# Patient Record
Sex: Female | Born: 1955 | Race: White | Hispanic: No | Marital: Married | Smoking: Never smoker
Health system: Southern US, Community
[De-identification: ages and names within clinical notes are randomized; demographics above are authoritative.]

## PROBLEM LIST (undated history)

## (undated) DIAGNOSIS — G610 Guillain-Barre syndrome: Secondary | ICD-10-CM

## (undated) DIAGNOSIS — M1990 Unspecified osteoarthritis, unspecified site: Secondary | ICD-10-CM

## (undated) DIAGNOSIS — A770 Spotted fever due to Rickettsia rickettsii: Secondary | ICD-10-CM

## (undated) DIAGNOSIS — F419 Anxiety disorder, unspecified: Secondary | ICD-10-CM

## (undated) DIAGNOSIS — D699 Hemorrhagic condition, unspecified: Secondary | ICD-10-CM

## (undated) HISTORY — PX: WISDOM TOOTH EXTRACTION: SHX21

## (undated) HISTORY — PX: OTHER SURGICAL HISTORY: SHX169

## (undated) HISTORY — PX: CATARACT EXTRACTION: SUR2

## (undated) HISTORY — PX: HEMORROIDECTOMY: SUR656

## (undated) HISTORY — PX: KNEE ARTHROPLASTY: SHX992

## (undated) HISTORY — PX: BACK SURGERY: SHX140

## (undated) HISTORY — DX: Guillain-Barre syndrome: G61.0

---

## 1979-06-15 DIAGNOSIS — G610 Guillain-Barre syndrome: Secondary | ICD-10-CM

## 1979-06-15 HISTORY — DX: Guillain-Barre syndrome: G61.0

## 2003-08-03 ENCOUNTER — Ambulatory Visit (HOSPITAL_COMMUNITY): Admission: RE | Admit: 2003-08-03 | Discharge: 2003-08-04 | Payer: Self-pay | Admitting: Neurosurgery

## 2014-03-01 ENCOUNTER — Other Ambulatory Visit (HOSPITAL_COMMUNITY): Payer: Self-pay | Admitting: Orthopedic Surgery

## 2014-03-01 ENCOUNTER — Other Ambulatory Visit: Payer: Self-pay | Admitting: Surgical

## 2014-03-02 ENCOUNTER — Encounter (HOSPITAL_COMMUNITY): Payer: Self-pay

## 2014-03-02 ENCOUNTER — Encounter (HOSPITAL_COMMUNITY)
Admission: RE | Admit: 2014-03-02 | Discharge: 2014-03-02 | Disposition: A | Discharge status: Home / Self Care | Payer: 59 | Source: Ambulatory Visit | Attending: Orthopedic Surgery | Admitting: Orthopedic Surgery

## 2014-03-02 ENCOUNTER — Encounter (HOSPITAL_COMMUNITY): Payer: Self-pay | Admitting: Pharmacy Technician

## 2014-03-02 ENCOUNTER — Ambulatory Visit (HOSPITAL_COMMUNITY)
Admission: RE | Admit: 2014-03-02 | Discharge: 2014-03-02 | Disposition: A | Discharge status: Home / Self Care | Payer: 59 | Source: Ambulatory Visit | Attending: Surgical | Admitting: Surgical

## 2014-03-02 DIAGNOSIS — G9589 Other specified diseases of spinal cord: Secondary | ICD-10-CM | POA: Insufficient documentation

## 2014-03-02 DIAGNOSIS — Z01818 Encounter for other preprocedural examination: Secondary | ICD-10-CM | POA: Insufficient documentation

## 2014-03-02 DIAGNOSIS — Z01812 Encounter for preprocedural laboratory examination: Secondary | ICD-10-CM | POA: Insufficient documentation

## 2014-03-02 DIAGNOSIS — Z01810 Encounter for preprocedural cardiovascular examination: Secondary | ICD-10-CM | POA: Insufficient documentation

## 2014-03-02 HISTORY — DX: Hemorrhagic condition, unspecified: D69.9

## 2014-03-02 HISTORY — DX: Anxiety disorder, unspecified: F41.9

## 2014-03-02 HISTORY — DX: Spotted fever due to Rickettsia rickettsii: A77.0

## 2014-03-02 HISTORY — DX: Unspecified osteoarthritis, unspecified site: M19.90

## 2014-03-02 HISTORY — DX: Guillain-Barre syndrome: G61.0

## 2014-03-02 LAB — COMPREHENSIVE METABOLIC PANEL
ALT: 22 U/L (ref 0–35)
AST: 20 U/L (ref 0–37)
Albumin: 4.4 g/dL (ref 3.5–5.2)
Alkaline Phosphatase: 68 U/L (ref 39–117)
BUN: 15 mg/dL (ref 6–23)
CO2: 26 mEq/L (ref 19–32)
Calcium: 10.3 mg/dL (ref 8.4–10.5)
Chloride: 101 mEq/L (ref 96–112)
Creatinine, Ser: 0.51 mg/dL (ref 0.50–1.10)
GFR calc Af Amer: >90 mL/min (ref >90)
GFR calc non Af Amer: >90 mL/min (ref >90)
Glucose, Bld: 84 mg/dL (ref 70–99)
Potassium: 4.2 mEq/L (ref 3.7–5.3)
Sodium: 142 mEq/L (ref 137–147)
Total Bilirubin: 0.4 mg/dL (ref 0.3–1.2)
Total Protein: 7.7 g/dL (ref 6.0–8.3)

## 2014-03-02 LAB — CBC
HEMATOCRIT: 42.5 % (ref 36.0–46.0)
Hemoglobin: 15.1 g/dL — ABNORMAL HIGH (ref 12.0–15.0)
MCH: 31.9 pg (ref 26.0–34.0)
MCHC: 35.5 g/dL (ref 30.0–36.0)
MCV: 89.9 fL (ref 78.0–100.0)
Platelets: 257 10*3/uL (ref 150–400)
RBC: 4.73 MIL/uL (ref 3.87–5.11)
RDW: 12.8 % (ref 11.5–15.5)
WBC: 6.8 10*3/uL (ref 4.0–10.5)

## 2014-03-02 LAB — URINALYSIS, ROUTINE W REFLEX MICROSCOPIC
Bilirubin Urine: NEGATIVE
Glucose, UA: NEGATIVE mg/dL
Hgb urine dipstick: NEGATIVE
Ketones, ur: NEGATIVE mg/dL
Leukocytes, UA: NEGATIVE
Nitrite: NEGATIVE
Protein, ur: NEGATIVE mg/dL
Specific Gravity, Urine: 1.018 (ref 1.005–1.030)
Urobilinogen, UA: 0.2 mg/dL (ref 0.0–1.0)
pH: 6 (ref 5.0–8.0)

## 2014-03-02 LAB — SURGICAL PCR SCREEN
MRSA, PCR: NEGATIVE
Staphylococcus aureus: NEGATIVE

## 2014-03-02 LAB — PROTIME-INR
INR: 1.02 (ref 0.00–1.49)
Prothrombin Time: 13.2 seconds (ref 11.6–15.2)

## 2014-03-02 LAB — APTT: aPTT: 31 seconds (ref 24–37)

## 2014-03-02 NOTE — Patient Instructions (Signed)
[address] Colleen Jefferson  03/02/2014   Your procedure is scheduled on: Tuesday June 2nd   [facility] and follow signs to  Short Stay Center at 1030 AM.  Call this number if you have problems the morning of surgery [phone]   Remember:  Do not eat food or drink liquids :After Midnight.     Take these medicines the morning of surgery with A SIP OF WATER: alprazolam if needed, cymbalta                               You may not have any metal on your body including hair pins and piercings  Do not wear jewelry, make-up, lotions, powders, or deodorant.   Men may shave face and neck.  Do not bring valuables to the hospital. [facility] IS NOT RESPONSIBLE FOR VALUABLES.  Contacts, dentures or bridgework may not be worn into surgery.  Leave suitcase in the car. After surgery it may be brought to your room.  For patients admitted to the hospital, checkout time is 11:00 AM the day of discharge.   Patients discharged the day of surgery will not be allowed to drive home.  Name and phone number of your driver:  Special Instructions: N/A  ________________________________________________________________________  [facility] - Preparing for [facility] Before surgery, you can play an important role.  Because skin is not sterile, your skin needs to be as free of germs as possible.  You can reduce the number of germs on your skin by washing with CHG (chlorahexidine gluconate) soap before surgery.  CHG is an antiseptic cleaner which kills germs and bonds with the skin to continue killing germs even after washing. Please DO NOT use if you have an allergy to CHG or antibacterial soaps.  If your skin becomes reddened/irritated stop using the CHG and inform your nurse when you arrive at Short Stay. Do not shave (including legs and underarms) for at least 48 hours prior to the first CHG shower.  You may shave your face/neck. Please follow these instructions carefully:  1.  Shower  with CHG Soap the night before surgery and the  morning of Surgery.  2.  If you choose to wash your hair, wash your hair first as usual with your  normal  shampoo.  3.  After you shampoo, rinse your hair and body thoroughly to remove the  shampoo.                           4.  Use CHG as you would any other liquid soap.  You can apply chg directly  to the skin and wash                       Gently with a scrungie or clean washcloth.  5.  Apply the CHG Soap to your body ONLY FROM THE NECK DOWN.   Do not use on face/ open                           Wound or open sores. Avoid contact with eyes, ears mouth and genitals (private parts).                       Wash face,  Genitals (private parts) with your normal soap.  6.  Wash thoroughly, paying special attention to the area where your surgery  will be performed.  7.  Thoroughly rinse your body with warm water from the neck down.  8.  DO NOT shower/wash with your normal soap after using and rinsing off  the CHG Soap.                9.  Pat yourself dry with a clean towel.            10.  Wear clean pajamas.            11.  Place clean sheets on your bed the night of your first shower and do not  sleep with pets. Day of Surgery : Do not apply any lotions/deodorants the morning of surgery.  Please wear clean clothes to the hospital/surgery center.  FAILURE TO FOLLOW THESE INSTRUCTIONS MAY RESULT IN THE CANCELLATION OF YOUR SURGERY PATIENT SIGNATURE_________________________________  NURSE SIGNATURE__________________________________  ________________________________________________________________________   Colleen Jefferson  An incentive spirometer is a tool that can help keep your lungs clear and active. This tool measures how well you are filling your lungs with each breath. Taking long deep breaths may help reverse or decrease the chance of developing breathing (pulmonary) problems (especially infection) following:  A long period  of time when you are unable to move or be active. BEFORE THE PROCEDURE   If the spirometer includes an indicator to show your best effort, your nurse or respiratory therapist will set it to a desired goal.  If possible, sit up straight or lean slightly forward. Try not to slouch.  Hold the incentive spirometer in an upright position. INSTRUCTIONS FOR USE  1. Sit on the edge of your bed if possible, or sit up as far as you can in bed or on a chair. 2. Hold the incentive spirometer in an upright position. 3. Breathe out normally. 4. Place the mouthpiece in your mouth and seal your lips tightly around it. 5. Breathe in slowly and as deeply as possible, raising the piston or the ball toward the top of the column. 6. Hold your breath for 3-5 seconds or for as long as possible. Allow the piston or ball to fall to the bottom of the column. 7. Remove the mouthpiece from your mouth and breathe out normally. 8. Rest for a few seconds and repeat Steps 1 through 7 at least 10 times every 1-2 hours when you are awake. Take your time and take a few normal breaths between deep breaths. 9. The spirometer may include an indicator to show your best effort. Use the indicator as a goal to work toward during each repetition. 10. After each set of 10 deep breaths, practice coughing to be sure your lungs are clear. If you have an incision (the cut made at the time of surgery), support your incision when coughing by placing a pillow or rolled up towels firmly against it. Once you are able to get out of bed, walk around indoors and cough well. You may stop using the incentive spirometer when instructed by your caregiver.  RISKS AND COMPLICATIONS  Take your time so you do not get dizzy or light-headed.  If you are in pain, you may need to take or ask for pain medication before doing incentive spirometry. It is harder to take a deep breath if you are having pain. AFTER USE  Rest and breathe slowly and easily.  It  can be helpful to keep track of a log of  your progress. Your caregiver can provide you with a simple table to help with this. If you are using the spirometer at home, follow these instructions: SEEK MEDICAL CARE IF:   You are having difficultly using the spirometer.  You have trouble using the spirometer as often as instructed.  Your pain medication is not giving enough relief while using the spirometer.  You develop fever of 100.5 F (38.1 C) or higher. SEEK IMMEDIATE MEDICAL CARE IF:   You cough up bloody sputum that had not been present before.  You develop fever of 102 F (38.9 C) or greater.  You develop worsening pain at or near the incision site. MAKE SURE YOU:   Understand these instructions.  Will watch your condition.  Will get help right away if you are not doing well or get worse. Document Released: 02/10/2007 Document Revised: 12/23/2011 Document Reviewed: 04/13/2007 ExitCare Patient Information 2014 [employer], [place].   ________________________________________________________________________  WHAT IS A BLOOD TRANSFUSION? Blood Transfusion Information  A transfusion is the replacement of blood or some of its parts. Blood is made up of multiple cells which provide different functions.  Red blood cells carry oxygen and are used for blood loss replacement.  White blood cells fight against infection.  Platelets control bleeding.  Plasma helps clot blood.  Other blood products are available for specialized needs, such as hemophilia or other clotting disorders. BEFORE THE TRANSFUSION  Who gives blood for transfusions?   Healthy volunteers who are fully evaluated to make sure their blood is safe. This is blood bank blood. Transfusion therapy is the safest it has ever been in the practice of medicine. Before blood is taken from a donor, a complete history is taken to make sure that person has no history of diseases nor engages in risky social behavior (examples  are intravenous drug use or sexual activity with multiple partners). The donor's travel history is screened to minimize risk of transmitting infections, such as malaria. The donated blood is tested for signs of infectious diseases, such as HIV and hepatitis. The blood is then tested to be sure it is compatible with you in order to minimize the chance of a transfusion reaction. If you or a relative donates blood, this is often done in anticipation of surgery and is not appropriate for emergency situations. It takes many days to process the donated blood. RISKS AND COMPLICATIONS Although transfusion therapy is very safe and saves many lives, the main dangers of transfusion include:   Getting an infectious disease.  Developing a transfusion reaction. This is an allergic reaction to something in the blood you were given. Every precaution is taken to prevent this. The decision to have a blood transfusion has been considered carefully by your caregiver before blood is given. Blood is not given unless the benefits outweigh the risks. AFTER THE TRANSFUSION  Right after receiving a blood transfusion, you will usually feel much better and more energetic. This is especially true if your red blood cells have gotten low (anemic). The transfusion raises the level of the red blood cells which carry oxygen, and this usually causes an energy increase.  The nurse administering the transfusion will monitor you carefully for complications. HOME CARE INSTRUCTIONS  No special instructions are needed after a transfusion. You may find your energy is better. Speak with your caregiver about any limitations on activity for underlying diseases you may have. SEEK MEDICAL CARE IF:   Your condition is not improving after your transfusion.  You develop redness or  irritation at the intravenous (IV) site. SEEK IMMEDIATE MEDICAL CARE IF:  Any of the following symptoms occur over the next 12 hours:  Shaking chills.  You have a  temperature by mouth above 102 F (38.9 C), not controlled by medicine.  Chest, back, or muscle pain.  People around you feel you are not acting correctly or are confused.  Shortness of breath or difficulty breathing.  Dizziness and fainting.  You get a rash or develop hives.  You have a decrease in urine output.  Your urine turns a dark color or changes to pink, red, or brown. Any of the following symptoms occur over the next 10 days:  You have a temperature by mouth above 102 F (38.9 C), not controlled by medicine.  Shortness of breath.  Weakness after normal activity.  The white part of the eye turns yellow (jaundice).  You have a decrease in the amount of urine or are urinating less often.  Your urine turns a dark color or changes to pink, red, or brown. Document Released: 09/27/2000 Document Revised: 12/23/2011 Document Reviewed: 05/16/2008 [facility] Patient Information 2014 [place], [place].  _______________________________________________________________________

## 2014-03-03 NOTE — H&P (Signed)
TOTAL HIP ADMISSION H&P  Patient is admitted for right total hip arthroplasty.  Subjective:  Chief Complaint: right hip pain  HPI: Colleen Jefferson, 58 y.o. female, has a history of pain and functional disability in the right hip(s) due to arthritis and patient has failed non-surgical conservative treatments for greater than 12 weeks to include NSAID's and/or analgesics, flexibility and strengthening excercises and activity modification.  Onset of symptoms was gradual starting 5 years ago with gradually worsening course since that time.The patient noted no past surgery on the right hip(s).  Patient currently rates pain in the right hip at 8 out of 10 with activity. Patient has night pain, worsening of pain with activity and weight bearing, pain that interfers with activities of daily living, pain with passive range of motion and crepitus. Patient has evidence of periarticular osteophytes and joint space narrowing by imaging studies. This condition presents safety issues increasing the risk of falls.   There is no current active infection.   Past Medical History  Diagnosis Date  . 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 Anxiety   . Arthritis   . Bleeds easily     when blood drawn  . [place] spotted fever 5 yrs ago    Past Surgical History  Procedure Laterality Date  . Melanoma removed from head  yrs ago  . Cervical neck surgery with plates and graft  15 yrs ago    stiffness in neck at times     Current outpatient prescriptions: acetaminophen (TYLENOL) 650 MG CR tablet, Take 650 mg by mouth every 8 (eight) hours as needed for pain., Disp: , Rfl: ;   ALPRAZolam (XANAX) 0.25 MG tablet, Take 0.25 mg by mouth every 6 (six) hours as needed for anxiety., Disp: , Rfl: ;   DULoxetine (CYMBALTA) 20 MG capsule, Take 20 mg by mouth every morning., Disp: , Rfl:  Multiple Vitamin (MULTIVITAMIN WITH MINERALS) TABS tablet, Take 1 tablet by mouth daily., Disp: , Rfl: ;   naproxen sodium (ANAPROX) 220 MG  tablet, Take 220 mg by mouth 2 (two) times daily as needed (pain)., Disp: , Rfl: ;   polycarbophil (FIBERCON) 625 MG tablet, Take 625 mg by mouth daily., Disp: , Rfl:   Allergies  Allergen Reactions  . Influenza Vaccines     gillian barre syndrome  . Nyquil Multi-Symptom [Pseudoeph-Doxylamine-Dm-Apap]     Makes skin crawl    History  Substance Use Topics  . Smoking status: Never Smoker   . Smokeless tobacco: Never Used  . Alcohol Use: No    Family History Father: CAD, DM type II Mother: HTN, CAD  Review of Systems  Constitutional: Negative.   HENT: Negative for congestion, ear discharge, ear pain, hearing loss, nosebleeds, sore throat and tinnitus.   Eyes: Negative.   Respiratory: Negative.  Negative for stridor.   Cardiovascular: Negative.   Gastrointestinal: Positive for diarrhea and constipation. Negative for heartburn, nausea, vomiting, abdominal pain, blood in stool and melena.  Genitourinary: Negative.   Musculoskeletal: Positive for joint pain. Negative for back pain, falls, myalgias and neck pain.       Right hip pain  Skin: Negative.   Neurological: Positive for headaches. Negative for dizziness, tingling, tremors, sensory change, speech change, focal weakness, seizures and loss of consciousness.  Endo/Heme/Allergies: Negative.   Psychiatric/Behavioral: Negative for depression, suicidal ideas, hallucinations, memory loss and substance abuse. The patient is nervous/anxious and has insomnia.     Objective:  Physical Exam  Constitutional: She is oriented to person, place,  and time. She appears well-developed and well-nourished. No distress.  HENT:  Head: Normocephalic and atraumatic.  Right Ear: External ear normal.  Left Ear: External ear normal.  Nose: Nose normal.  Mouth/Throat: Oropharynx is clear and moist.  Eyes: Conjunctivae and EOM are normal.  Neck: Normal range of motion. Neck supple.  Cardiovascular: Normal rate, regular rhythm, normal heart sounds and  intact distal pulses.   No murmur heard. Respiratory: Effort normal and breath sounds normal. No respiratory distress. She has no wheezes.  GI: Soft. Bowel sounds are normal. She exhibits no distension. There is no tenderness.  Musculoskeletal:       Right hip: She exhibits decreased range of motion, decreased strength and crepitus.       Left hip: Normal.       Right knee: Normal.       Left knee: Normal.       Right lower leg: She exhibits no tenderness and no swelling.       Left lower leg: She exhibits no tenderness and no swelling.  Exam shows she has an antalgic gait on the right. She has marked limited range of motion of her right hip. She can flex up to about 80 degrees. Internal and external rotation is absent.Good function in her leg and her knee. Good dorsiflexion and plantar flexion of her foot.  Neurological: She is alert and oriented to person, place, and time. She has normal strength and normal reflexes. No sensory deficit.  Skin: No rash noted. She is not diaphoretic. No erythema.  Psychiatric: She has a normal mood and affect. Her behavior is normal.    Vitals Weight: 160 lb Height: 61 in Body Surface Area: 1.77 m Body Mass Index: 30.23 kg/m Pulse: 88 (Regular) BP: 126/74 (Sitting, Left Arm, Standard)  Imaging Review Plain radiographs demonstrate severe degenerative joint disease of the right hip(s). The bone quality appears to be good for age and reported activity level.  Assessment/Plan:  End stage arthritis, right hip(s)  The patient history, physical examination, clinical judgement of the provider and imaging studies are consistent with end stage degenerative joint disease of the right hip(s) and total hip arthroplasty is deemed medically necessary. The treatment options including medical management, injection therapy, arthroscopy and arthroplasty were discussed at length. The risks and benefits of total hip arthroplasty were presented and  reviewed. The risks due to aseptic loosening, infection, stiffness, dislocation/subluxation,  thromboembolic complications and other imponderables were discussed.  The patient acknowledged the explanation, agreed to proceed with the plan and consent was signed. Patient is being admitted for inpatient treatment for surgery, pain control, PT, OT, prophylactic antibiotics, VTE prophylaxis, progressive ambulation and ADL's and discharge planning.The patient is planning to be discharged home with home health services     [place] City[place]mber Simrat Kendrick, [place]

## 2014-03-15 ENCOUNTER — Inpatient Hospital Stay (HOSPITAL_COMMUNITY): Payer: 59

## 2014-03-15 ENCOUNTER — Inpatient Hospital Stay (HOSPITAL_COMMUNITY)
Admission: RE | Admit: 2014-03-15 | Discharge: 2014-03-18 | DRG: 470 | Disposition: A | Discharge status: Skilled Nursing Facility | Payer: 59 | Source: Ambulatory Visit | Attending: Orthopedic Surgery | Admitting: Orthopedic Surgery

## 2014-03-15 ENCOUNTER — Encounter (HOSPITAL_COMMUNITY)
Admission: RE | Disposition: A | Discharge status: Skilled Nursing Facility | Payer: Self-pay | Source: Ambulatory Visit | Attending: Orthopedic Surgery

## 2014-03-15 ENCOUNTER — Ambulatory Visit (HOSPITAL_COMMUNITY): Payer: 59 | Admitting: Anesthesiology

## 2014-03-15 ENCOUNTER — Encounter (HOSPITAL_COMMUNITY): Payer: 59 | Admitting: Anesthesiology

## 2014-03-15 ENCOUNTER — Encounter (HOSPITAL_COMMUNITY): Payer: Self-pay | Admitting: *Deleted

## 2014-03-15 DIAGNOSIS — Z79899 Other long term (current) drug therapy: Secondary | ICD-10-CM

## 2014-03-15 DIAGNOSIS — M1610 Unilateral primary osteoarthritis, unspecified hip: Principal | ICD-10-CM | POA: Diagnosis present

## 2014-03-15 DIAGNOSIS — Z888 Allergy status to other drugs, medicaments and biological substances status: Secondary | ICD-10-CM

## 2014-03-15 DIAGNOSIS — K5900 Constipation, unspecified: Secondary | ICD-10-CM | POA: Diagnosis not present

## 2014-03-15 DIAGNOSIS — Z96649 Presence of unspecified artificial hip joint: Secondary | ICD-10-CM

## 2014-03-15 DIAGNOSIS — Z7901 Long term (current) use of anticoagulants: Secondary | ICD-10-CM

## 2014-03-15 DIAGNOSIS — Z833 Family history of diabetes mellitus: Secondary | ICD-10-CM

## 2014-03-15 DIAGNOSIS — M169 Osteoarthritis of hip, unspecified: Principal | ICD-10-CM | POA: Diagnosis present

## 2014-03-15 DIAGNOSIS — Z887 Allergy status to serum and vaccine status: Secondary | ICD-10-CM

## 2014-03-15 DIAGNOSIS — I959 Hypotension, unspecified: Secondary | ICD-10-CM | POA: Diagnosis not present

## 2014-03-15 DIAGNOSIS — M1611 Unilateral primary osteoarthritis, right hip: Secondary | ICD-10-CM

## 2014-03-15 DIAGNOSIS — M24559 Contracture, unspecified hip: Secondary | ICD-10-CM | POA: Diagnosis present

## 2014-03-15 DIAGNOSIS — D62 Acute posthemorrhagic anemia: Secondary | ICD-10-CM | POA: Diagnosis not present

## 2014-03-15 DIAGNOSIS — F411 Generalized anxiety disorder: Secondary | ICD-10-CM | POA: Diagnosis present

## 2014-03-15 DIAGNOSIS — Z8249 Family history of ischemic heart disease and other diseases of the circulatory system: Secondary | ICD-10-CM

## 2014-03-15 HISTORY — PX: TOTAL HIP ARTHROPLASTY: SHX124

## 2014-03-15 LAB — ABO/RH: ABO/RH(D): A POS

## 2014-03-15 SURGERY — ARTHROPLASTY, HIP, TOTAL,POSTERIOR APPROACH
Anesthesia: General | Site: Hip | Laterality: Right

## 2014-03-15 MED ORDER — LACTATED RINGERS IV SOLN
INTRAVENOUS | Status: DC
  Administered 2014-03-15 (×2): 1000 mL via INTRAVENOUS

## 2014-03-15 MED ORDER — ADULT MULTIVITAMIN W/MINERALS CH
1.0000 | ORAL_TABLET | Freq: Every day | ORAL | Status: DC
  Administered 2014-03-15 – 2014-03-18 (×4): 1 via ORAL
  Filled 2014-03-15 (×4): qty 1

## 2014-03-15 MED ORDER — PROPOFOL 10 MG/ML IV BOLUS
INTRAVENOUS | Status: AC
  Filled 2014-03-15: qty 20

## 2014-03-15 MED ORDER — HYDROMORPHONE HCL PF 2 MG/ML IJ SOLN
INTRAMUSCULAR | Status: AC
  Filled 2014-03-15: qty 1

## 2014-03-15 MED ORDER — CISATRACURIUM BESYLATE (PF) 10 MG/5ML IV SOLN
INTRAVENOUS | Status: DC | PRN
  Administered 2014-03-15: 2 mg via INTRAVENOUS
  Administered 2014-03-15: 6 mg via INTRAVENOUS

## 2014-03-15 MED ORDER — HYDROMORPHONE HCL PF 1 MG/ML IJ SOLN
0.2500 mg | INTRAMUSCULAR | Status: DC | PRN
  Administered 2014-03-15 (×2): 0.25 mg via INTRAVENOUS
  Administered 2014-03-15: 0.5 mg via INTRAVENOUS

## 2014-03-15 MED ORDER — ONDANSETRON HCL 4 MG/2ML IJ SOLN
INTRAMUSCULAR | Status: DC | PRN
  Administered 2014-03-15 (×2): 2 mg via INTRAVENOUS

## 2014-03-15 MED ORDER — ACETAMINOPHEN 650 MG RE SUPP: 650.0000 mg | Freq: Four times a day (QID) | RECTAL | Status: DC | PRN

## 2014-03-15 MED ORDER — ACETAMINOPHEN 325 MG PO TABS
650.0000 mg | ORAL_TABLET | Freq: Four times a day (QID) | ORAL | Status: DC | PRN
  Administered 2014-03-17: 650 mg via ORAL
  Filled 2014-03-15: qty 2

## 2014-03-15 MED ORDER — HYDROMORPHONE HCL PF 1 MG/ML IJ SOLN
1.0000 mg | INTRAMUSCULAR | Status: DC | PRN
  Administered 2014-03-16: 1 mg via INTRAVENOUS
  Filled 2014-03-15: qty 1

## 2014-03-15 MED ORDER — FENTANYL CITRATE 0.05 MG/ML IJ SOLN
INTRAMUSCULAR | Status: DC | PRN
  Administered 2014-03-15: 100 ug via INTRAVENOUS
  Administered 2014-03-15 (×3): 50 ug via INTRAVENOUS

## 2014-03-15 MED ORDER — MEPERIDINE HCL 50 MG/ML IJ SOLN
INTRAMUSCULAR | Status: AC
  Filled 2014-03-15: qty 1

## 2014-03-15 MED ORDER — ALUM & MAG HYDROXIDE-SIMETH 200-200-20 MG/5ML PO SUSP: 30.0000 mL | ORAL | Status: DC | PRN

## 2014-03-15 MED ORDER — KETAMINE HCL 10 MG/ML IJ SOLN
INTRAMUSCULAR | Status: AC
  Filled 2014-03-15: qty 1

## 2014-03-15 MED ORDER — FLEET ENEMA 7-19 GM/118ML RE ENEM: 1.0000 | ENEMA | Freq: Once | RECTAL | Status: AC | PRN

## 2014-03-15 MED ORDER — STERILE WATER FOR IRRIGATION IR SOLN
Status: DC | PRN
  Administered 2014-03-15: 1500 mL

## 2014-03-15 MED ORDER — ALBUMIN HUMAN 5 % IV SOLN
INTRAVENOUS | Status: AC
  Filled 2014-03-15: qty 250

## 2014-03-15 MED ORDER — ONDANSETRON HCL 4 MG/2ML IJ SOLN: 4.0000 mg | Freq: Four times a day (QID) | INTRAMUSCULAR | Status: DC | PRN

## 2014-03-15 MED ORDER — POLYMYXIN B SULFATE 500000 UNITS IJ SOLR
INTRAMUSCULAR | Status: DC | PRN
  Administered 2014-03-15: 14:00:00

## 2014-03-15 MED ORDER — KETAMINE HCL 10 MG/ML IJ SOLN
INTRAMUSCULAR | Status: DC | PRN
  Administered 2014-03-15: 5 mg via INTRAVENOUS
  Administered 2014-03-15 (×2): 10 mg via INTRAVENOUS

## 2014-03-15 MED ORDER — CEFAZOLIN SODIUM-DEXTROSE 2-3 GM-% IV SOLR
INTRAVENOUS | Status: AC
  Filled 2014-03-15: qty 50

## 2014-03-15 MED ORDER — PROMETHAZINE HCL 25 MG/ML IJ SOLN: 6.2500 mg | INTRAMUSCULAR | Status: DC | PRN

## 2014-03-15 MED ORDER — CALCIUM POLYCARBOPHIL 625 MG PO TABS
625.0000 mg | ORAL_TABLET | Freq: Every day | ORAL | Status: DC
  Administered 2014-03-15: 21:00:00 via ORAL
  Administered 2014-03-16: 625 mg via ORAL
  Administered 2014-03-17: 10:00:00 via ORAL
  Administered 2014-03-18: 625 mg via ORAL
  Filled 2014-03-15 (×4): qty 1

## 2014-03-15 MED ORDER — BACITRACIN-NEOMYCIN-POLYMYXIN OINTMENT TUBE
TOPICAL_OINTMENT | CUTANEOUS | Status: DC | PRN
  Administered 2014-03-15: 1 via TOPICAL

## 2014-03-15 MED ORDER — MEPERIDINE HCL 50 MG/ML IJ SOLN
6.2500 mg | INTRAMUSCULAR | Status: DC | PRN
Start: 2014-03-15 — End: 2014-03-15
  Administered 2014-03-15: 6.25 mg via INTRAVENOUS

## 2014-03-15 MED ORDER — SODIUM CHLORIDE 0.9 % IV SOLN
INTRAVENOUS | Status: DC | PRN
  Administered 2014-03-15: 14:00:00 via INTRAVENOUS

## 2014-03-15 MED ORDER — THROMBIN 5000 UNITS EX SOLR
CUTANEOUS | Status: AC
  Filled 2014-03-15: qty 5000

## 2014-03-15 MED ORDER — DULOXETINE HCL 20 MG PO CPEP
20.0000 mg | ORAL_CAPSULE | Freq: Every morning | ORAL | Status: DC
  Administered 2014-03-16 – 2014-03-18 (×3): 20 mg via ORAL
  Filled 2014-03-15 (×3): qty 1

## 2014-03-15 MED ORDER — MIDAZOLAM HCL 5 MG/5ML IJ SOLN
INTRAMUSCULAR | Status: DC | PRN
  Administered 2014-03-15: 1 mg via INTRAVENOUS

## 2014-03-15 MED ORDER — RIVAROXABAN 10 MG PO TABS
10.0000 mg | ORAL_TABLET | Freq: Every day | ORAL | Status: DC
  Administered 2014-03-16 – 2014-03-18 (×3): 10 mg via ORAL
  Filled 2014-03-15 (×4): qty 1

## 2014-03-15 MED ORDER — THROMBIN 5000 UNITS EX SOLR
CUTANEOUS | Status: DC | PRN
  Administered 2014-03-15: 10000 [IU] via TOPICAL

## 2014-03-15 MED ORDER — HYDROCODONE-ACETAMINOPHEN 5-325 MG PO TABS
1.0000 | ORAL_TABLET | ORAL | Status: DC | PRN
  Administered 2014-03-15 (×2): 1 via ORAL
  Administered 2014-03-16: 2 via ORAL
  Administered 2014-03-16: 1 via ORAL
  Administered 2014-03-16: 2 via ORAL
  Administered 2014-03-16: 1 via ORAL
  Administered 2014-03-16 – 2014-03-17 (×4): 2 via ORAL
  Filled 2014-03-15: qty 1
  Filled 2014-03-15 (×2): qty 2
  Filled 2014-03-15: qty 1
  Filled 2014-03-15 (×2): qty 2
  Filled 2014-03-15: qty 1
  Filled 2014-03-15: qty 2
  Filled 2014-03-15: qty 1
  Filled 2014-03-15: qty 2

## 2014-03-15 MED ORDER — MIDAZOLAM HCL 2 MG/2ML IJ SOLN
INTRAMUSCULAR | Status: AC
  Filled 2014-03-15: qty 2

## 2014-03-15 MED ORDER — OXYCODONE-ACETAMINOPHEN 5-325 MG PO TABS
2.0000 | ORAL_TABLET | ORAL | Status: DC | PRN
  Administered 2014-03-17 – 2014-03-18 (×2): 2 via ORAL
  Administered 2014-03-18: 1 via ORAL
  Filled 2014-03-15 (×3): qty 2

## 2014-03-15 MED ORDER — FERROUS SULFATE 325 (65 FE) MG PO TABS
325.0000 mg | ORAL_TABLET | Freq: Three times a day (TID) | ORAL | Status: DC
  Administered 2014-03-15 – 2014-03-18 (×9): 325 mg via ORAL
  Filled 2014-03-15 (×11): qty 1

## 2014-03-15 MED ORDER — LACTATED RINGERS IV SOLN
INTRAVENOUS | Status: DC | PRN
  Administered 2014-03-15 (×4): via INTRAVENOUS

## 2014-03-15 MED ORDER — CEFAZOLIN SODIUM-DEXTROSE 2-3 GM-% IV SOLR
2.0000 g | INTRAVENOUS | Status: AC
  Administered 2014-03-15: 2 g via INTRAVENOUS

## 2014-03-15 MED ORDER — EPHEDRINE SULFATE 50 MG/ML IJ SOLN
INTRAMUSCULAR | Status: DC | PRN
  Administered 2014-03-15: 10 mg via INTRAVENOUS
  Administered 2014-03-15: 5 mg via INTRAVENOUS

## 2014-03-15 MED ORDER — ALPRAZOLAM 0.25 MG PO TABS
0.2500 mg | ORAL_TABLET | Freq: Four times a day (QID) | ORAL | Status: DC | PRN
  Administered 2014-03-16 – 2014-03-17 (×3): 0.25 mg via ORAL
  Filled 2014-03-15 (×3): qty 1

## 2014-03-15 MED ORDER — PROPOFOL 10 MG/ML IV BOLUS
INTRAVENOUS | Status: DC | PRN
  Administered 2014-03-15: 175 mg via INTRAVENOUS

## 2014-03-15 MED ORDER — LIDOCAINE HCL (CARDIAC) 20 MG/ML IV SOLN
INTRAVENOUS | Status: AC
  Filled 2014-03-15: qty 5

## 2014-03-15 MED ORDER — CEFAZOLIN SODIUM 1-5 GM-% IV SOLN
1.0000 g | Freq: Four times a day (QID) | INTRAVENOUS | Status: AC
  Administered 2014-03-15 – 2014-03-16 (×2): 1 g via INTRAVENOUS
  Filled 2014-03-15 (×2): qty 50

## 2014-03-15 MED ORDER — LACTATED RINGERS IV SOLN: INTRAVENOUS | Status: DC

## 2014-03-15 MED ORDER — SODIUM CHLORIDE 0.9 % IJ SOLN
INTRAMUSCULAR | Status: AC
  Filled 2014-03-15: qty 20

## 2014-03-15 MED ORDER — POLYETHYLENE GLYCOL 3350 17 G PO PACK
17.0000 g | PACK | Freq: Every day | ORAL | Status: DC | PRN
  Administered 2014-03-17 (×2): 17 g via ORAL

## 2014-03-15 MED ORDER — METHOCARBAMOL 1000 MG/10ML IJ SOLN
500.0000 mg | Freq: Four times a day (QID) | INTRAVENOUS | Status: DC | PRN
  Administered 2014-03-15: 500 mg via INTRAVENOUS
  Filled 2014-03-15: qty 5

## 2014-03-15 MED ORDER — ALBUMIN HUMAN 5 % IV SOLN
INTRAVENOUS | Status: DC | PRN
  Administered 2014-03-15: 14:00:00 via INTRAVENOUS

## 2014-03-15 MED ORDER — BUPIVACAINE LIPOSOME 1.3 % IJ SUSP
20.0000 mL | Freq: Once | INTRAMUSCULAR | Status: AC
  Administered 2014-03-15: 20 mL
  Filled 2014-03-15: qty 20

## 2014-03-15 MED ORDER — HYDROMORPHONE HCL PF 1 MG/ML IJ SOLN
INTRAMUSCULAR | Status: DC | PRN
  Administered 2014-03-15: 0.5 mg via INTRAVENOUS

## 2014-03-15 MED ORDER — METHOCARBAMOL 500 MG PO TABS
500.0000 mg | ORAL_TABLET | Freq: Four times a day (QID) | ORAL | Status: DC | PRN
  Administered 2014-03-16 – 2014-03-18 (×3): 500 mg via ORAL
  Filled 2014-03-15 (×3): qty 1

## 2014-03-15 MED ORDER — BACITRACIN-NEOMYCIN-POLYMYXIN 400-5-5000 EX OINT
TOPICAL_OINTMENT | CUTANEOUS | Status: AC
  Filled 2014-03-15: qty 1

## 2014-03-15 MED ORDER — PHENOL 1.4 % MT LIQD
1.0000 | OROMUCOSAL | Status: DC | PRN
  Filled 2014-03-15: qty 177

## 2014-03-15 MED ORDER — SUCCINYLCHOLINE CHLORIDE 20 MG/ML IJ SOLN
INTRAMUSCULAR | Status: DC | PRN
  Administered 2014-03-15: 100 mg via INTRAVENOUS

## 2014-03-15 MED ORDER — LIDOCAINE HCL (CARDIAC) 20 MG/ML IV SOLN
INTRAVENOUS | Status: DC | PRN
  Administered 2014-03-15: 30 mg via INTRAVENOUS

## 2014-03-15 MED ORDER — BISACODYL 5 MG PO TBEC
5.0000 mg | DELAYED_RELEASE_TABLET | Freq: Every day | ORAL | Status: DC | PRN
  Administered 2014-03-17 – 2014-03-18 (×2): 5 mg via ORAL
  Filled 2014-03-15 (×2): qty 1

## 2014-03-15 MED ORDER — ONDANSETRON HCL 4 MG PO TABS: 4.0000 mg | ORAL_TABLET | Freq: Four times a day (QID) | ORAL | Status: DC | PRN

## 2014-03-15 MED ORDER — HYDROMORPHONE HCL PF 1 MG/ML IJ SOLN
INTRAMUSCULAR | Status: AC
  Filled 2014-03-15: qty 1

## 2014-03-15 MED ORDER — LACTATED RINGERS IV SOLN
INTRAVENOUS | Status: DC
  Administered 2014-03-15: 1000 mL via INTRAVENOUS

## 2014-03-15 MED ORDER — FENTANYL CITRATE 0.05 MG/ML IJ SOLN
INTRAMUSCULAR | Status: AC
  Filled 2014-03-15: qty 5

## 2014-03-15 MED ORDER — MENTHOL 3 MG MT LOZG
1.0000 | LOZENGE | OROMUCOSAL | Status: DC | PRN
  Filled 2014-03-15: qty 9

## 2014-03-15 MED ORDER — DEXAMETHASONE SODIUM PHOSPHATE 10 MG/ML IJ SOLN
INTRAMUSCULAR | Status: AC
  Filled 2014-03-15: qty 1

## 2014-03-15 SURGICAL SUPPLY — 57 items
BAG ZIPLOCK 12X15 (MISCELLANEOUS) ×2 IMPLANT
BLADE SAW SAG 73X25 THK (BLADE) ×1
BLADE SAW SGTL 73X25 THK (BLADE) ×1 IMPLANT
CAPT HIP PF MOP ×2 IMPLANT
DERMABOND ADVANCED (GAUZE/BANDAGES/DRESSINGS)
DERMABOND ADVANCED .7 DNX12 (GAUZE/BANDAGES/DRESSINGS) IMPLANT
DRAPE INCISE IOBAN 85X60 (DRAPES) ×2 IMPLANT
DRAPE ORTHO SPLIT 77X108 STRL (DRAPES) ×2
DRAPE POUCH INSTRU U-SHP 10X18 (DRAPES) ×2 IMPLANT
DRAPE SURG 17X11 SM STRL (DRAPES) ×2 IMPLANT
DRAPE SURG ORHT 6 SPLT 77X108 (DRAPES) ×2 IMPLANT
DRAPE U-SHAPE 47X51 STRL (DRAPES) ×2 IMPLANT
DRSG ADAPTIC 3X8 NADH LF (GAUZE/BANDAGES/DRESSINGS) IMPLANT
DRSG AQUACEL AG ADV 3.5X10 (GAUZE/BANDAGES/DRESSINGS) ×2 IMPLANT
DRSG AQUACEL AG ADV 3.5X14 (GAUZE/BANDAGES/DRESSINGS) IMPLANT
DRSG OPSITE POSTOP 4X8 (GAUZE/BANDAGES/DRESSINGS) ×2 IMPLANT
DRSG PAD ABDOMINAL 8X10 ST (GAUZE/BANDAGES/DRESSINGS) IMPLANT
DRSG TEGADERM 4X4.75 (GAUZE/BANDAGES/DRESSINGS) ×2 IMPLANT
DURAPREP 26ML APPLICATOR (WOUND CARE) ×2 IMPLANT
ELECT BLADE TIP CTD 4 INCH (ELECTRODE) ×4 IMPLANT
ELECT REM PT RETURN 9FT ADLT (ELECTROSURGICAL) ×2
ELECTRODE REM PT RTRN 9FT ADLT (ELECTROSURGICAL) ×1 IMPLANT
EVACUATOR 1/8 PVC DRAIN (DRAIN) ×2 IMPLANT
FACESHIELD WRAPAROUND (MASK) ×10 IMPLANT
GAUZE SPONGE 2X2 8PLY STRL LF (GAUZE/BANDAGES/DRESSINGS) ×1 IMPLANT
GLOVE BIOGEL PI IND STRL 8 (GLOVE) ×1 IMPLANT
GLOVE BIOGEL PI INDICATOR 8 (GLOVE) ×1
GLOVE ECLIPSE 8.0 STRL XLNG CF (GLOVE) ×4 IMPLANT
GLOVE SURG SS PI 6.5 STRL IVOR (GLOVE) IMPLANT
GOWN STRL REUS W/TWL LRG LVL3 (GOWN DISPOSABLE) ×2 IMPLANT
GOWN STRL REUS W/TWL XL LVL3 (GOWN DISPOSABLE) ×2 IMPLANT
IMMOBILIZER KNEE 20 (SOFTGOODS) ×2 IMPLANT
KIT BASIN OR (CUSTOM PROCEDURE TRAY) ×2 IMPLANT
MANIFOLD NEPTUNE II (INSTRUMENTS) ×2 IMPLANT
NEEDLE HYPO 22GX1.5 SAFETY (NEEDLE) ×2 IMPLANT
PACK TOTAL JOINT (CUSTOM PROCEDURE TRAY) ×2 IMPLANT
PENCIL BUTTON HOLSTER BLD 10FT (ELECTRODE) ×2 IMPLANT
POSITIONER SURGICAL ARM (MISCELLANEOUS) ×2 IMPLANT
SPONGE GAUZE 2X2 STER 10/PKG (GAUZE/BANDAGES/DRESSINGS) ×1
SPONGE GAUZE 4X4 12PLY (GAUZE/BANDAGES/DRESSINGS) IMPLANT
SPONGE LAP 18X18 X RAY DECT (DISPOSABLE) IMPLANT
SPONGE LAP 4X18 X RAY DECT (DISPOSABLE) ×2 IMPLANT
SPONGE SURGIFOAM ABS GEL 100 (HEMOSTASIS) ×2 IMPLANT
STAPLER VISISTAT 35W (STAPLE) IMPLANT
SUCTION FRAZIER TIP 10 FR DISP (SUCTIONS) ×2 IMPLANT
SUT MNCRL AB 4-0 PS2 18 (SUTURE) ×2 IMPLANT
SUT VIC AB 0 CT1 27 (SUTURE) ×2
SUT VIC AB 0 CT1 27XBRD ANTBC (SUTURE) ×2 IMPLANT
SUT VIC AB 1 CT1 27 (SUTURE) ×4
SUT VIC AB 1 CT1 27XBRD ANTBC (SUTURE) ×4 IMPLANT
SUT VIC AB 2-0 CT1 27 (SUTURE) ×3
SUT VIC AB 2-0 CT1 TAPERPNT 27 (SUTURE) ×3 IMPLANT
SUT VLOC 180 0 24IN GS25 (SUTURE) ×2 IMPLANT
SYR 20CC LL (SYRINGE) ×2 IMPLANT
TOWEL OR 17X26 10 PK STRL BLUE (TOWEL DISPOSABLE) ×4 IMPLANT
TRAY FOLEY CATH 14FRSI W/METER (CATHETERS) ×2 IMPLANT
WATER STERILE IRR 1500ML POUR (IV SOLUTION) IMPLANT

## 2014-03-15 NOTE — Interval H&P Note (Signed)
History and Physical Interval Note:  03/15/2014 12:39 PM  Colleen Jefferson  has presented today for surgery, with the diagnosis of OA OF RIGHT HIP  The various methods of treatment have been discussed with the patient and family. After consideration of risks, benefits and other options for treatment, the patient has consented to  Procedure(s): RIGHT TOTAL HIP ARTHROPLASTY (Right) as a surgical intervention .  The patient's history has been reviewed, patient examined, no change in status, stable for surgery.  I have reviewed the patient's chart and labs.  Questions were answered to the patient's satisfaction.     Jacki Cones

## 2014-03-15 NOTE — Anesthesia Preprocedure Evaluation (Addendum)

## 2014-03-15 NOTE — Anesthesia Postprocedure Evaluation (Signed)
  Anesthesia Post-op Note  Patient: Colleen Jefferson  Procedure(s) Performed: Procedure(s) (LRB): RIGHT TOTAL HIP ARTHROPLASTY (Right)  Patient Location: PACU  Anesthesia Type: General  Level of Consciousness: awake and alert   Airway and Oxygen Therapy: Patient Spontanous Breathing  Post-op Pain: mild  Post-op Assessment: Post-op Vital signs reviewed, Patient's Cardiovascular Status Stable, Respiratory Function Stable, Patent Airway and No signs of Nausea or vomiting  Last Vitals:  Filed Vitals:   03/15/14 1026  BP: 134/73  Pulse: 80  Temp: 36.5 C  Resp: 16    Post-op Vital Signs: stable   Complications: No apparent anesthesia complications

## 2014-03-15 NOTE — Brief Op Note (Signed)
03/15/2014  2:59 PM  PATIENT:  Colleen Jefferson  58 y.o. female  PRE-OPERATIVE DIAGNOSIS:  OSTEOARTHRITIS OF RIGHT HIP  POST-OPERATIVE DIAGNOSIS:  OSTEOARTHRITIS OF RIGHT HIP.Complex  PROCEDURE:  Procedure(s): RIGHT TOTAL HIP ARTHROPLASTY (Right)  SURGEON:  Surgeon(s) and Role:    * Jacki Cones, MD - Primary    * Drucilla Schmidt, MD - Assisting  PHYSICIAN ASSISTANT:Amber Thedford PA   ASSISTANTS: Marlowe Kays MD and Dimitri Ped PA  ANESTHESIA:   general  EBL:  Total I/O In: 4150 [I.V.:3900; IV Piggyback:250] Out: 1400 [Urine:150; Blood:1250]  BLOOD ADMINISTERED:none  DRAINS: (One) Hemovact drain(s) in the Right Hip with  Suction Open   LOCAL MEDICATIONS USED:  BUPIVICAINE 20cc mixed with 20cc of Normal Saline.  SPECIMEN:  No Specimen  DISPOSITION OF SPECIMEN:  N/A  COUNTS:  YES  TOURNIQUET:  * No tourniquets in log *  DICTATION: .Other Dictation: Dictation Number [id number]  PLAN OF CARE: Admit to inpatient   PATIENT DISPOSITION:  Stable in OR   Delay start of Pharmacological VTE agent (>24hrs) due to surgical blood loss or risk of bleeding: yes

## 2014-03-15 NOTE — Plan of Care (Signed)
Problem: Consults Goal: Diagnosis- Total Joint Replacement Primary Total Hip     

## 2014-03-15 NOTE — Transfer of Care (Signed)
Immediate Anesthesia Transfer of Care Note  Patient: Colleen Jefferson  Procedure(s) Performed: Procedure(s): RIGHT TOTAL HIP ARTHROPLASTY (Right)  Patient Location: PACU  Anesthesia Type:General  Level of Consciousness: awake, sedated and patient cooperative  Airway & Oxygen Therapy: Patient Spontanous Breathing and Patient connected to face mask oxygen  Post-op Assessment: Report given to PACU RN and Post -op Vital signs reviewed and stable  Post vital signs: Reviewed and stable  Complications: No apparent anesthesia complications

## 2014-03-16 ENCOUNTER — Encounter (HOSPITAL_COMMUNITY): Payer: Self-pay | Admitting: *Deleted

## 2014-03-16 LAB — CBC
HCT: 25.1 % — ABNORMAL LOW (ref 36.0–46.0)
HEMOGLOBIN: 9.1 g/dL — AB (ref 12.0–15.0)
MCH: 32.5 pg (ref 26.0–34.0)
MCHC: 36.3 g/dL — ABNORMAL HIGH (ref 30.0–36.0)
MCV: 89.6 fL (ref 78.0–100.0)
Platelets: 211 10*3/uL (ref 150–400)
RBC: 2.8 MIL/uL — AB (ref 3.87–5.11)
RDW: 13 % (ref 11.5–15.5)
WBC: 12.2 10*3/uL — ABNORMAL HIGH (ref 4.0–10.5)

## 2014-03-16 LAB — BASIC METABOLIC PANEL
BUN: 10 mg/dL (ref 6–23)
CHLORIDE: 102 meq/L (ref 96–112)
CO2: 23 meq/L (ref 19–32)
CREATININE: 0.46 mg/dL — AB (ref 0.50–1.10)
Calcium: 8.8 mg/dL (ref 8.4–10.5)
GFR calc non Af Amer: >90 mL/min (ref >90)
Glucose, Bld: 135 mg/dL — ABNORMAL HIGH (ref 70–99)
Potassium: 4.1 mEq/L (ref 3.7–5.3)
SODIUM: 137 meq/L (ref 137–147)

## 2014-03-16 MED ORDER — LACTATED RINGERS IV SOLN: INTRAVENOUS | Status: DC

## 2014-03-16 MED ORDER — DOCUSATE SODIUM 100 MG PO CAPS
100.0000 mg | ORAL_CAPSULE | Freq: Two times a day (BID) | ORAL | Status: DC
  Administered 2014-03-16 – 2014-03-18 (×5): 100 mg via ORAL

## 2014-03-16 NOTE — Clinical Social Work Placement (Signed)
Clinical Social Work Department BRIEF PSYCHOSOCIAL ASSESSMENT 03/16/2014  Patient:  Colleen Jefferson, Colleen Jefferson     Account Number:  [id number]     Admit date:  03/15/2014  Clinical Social Worker:  Daiva Huge  Date/Time:  03/16/2014 12:23 PM  Referred by:  Physician  Date Referred:  03/16/2014 Referred for  SNF Placement   Other Referral:   Interview type:  Patient Other interview type:    PSYCHOSOCIAL DATA Living Status:  HUSBAND Admitted from facility:   Level of care:   Primary support name:  husband Primary support relationship to patient:  FAMILY Degree of support available:   good    CURRENT CONCERNS Current Concerns  Post-Acute Placement   Other Concerns:    SOCIAL WORK ASSESSMENT / PLAN CSW met with patient who reports she is recovering from total hip replacement-  she has wokred with therapy already this morning and is feeling good about her progress, however, feels a SNF rehab stay will be beneficial.  CSW discussed SNF process and she is open to this and interested in [facility] where she works-   Assessment/plan status:  Other - See comment Other assessment/ plan:   CSW will complete FL2 and PASARR for SNF search   Information/referral to community resources:   SNF list    PATIENT'S/FAMILY'S RESPONSE TO PLAN OF CARE: Patient reports that she works at [employer] and would like to pursue this at d.c for rehab-  She is familiar with the SNF process and feels this will be a good transition for her before returning home with her husband.  CSW will begin SNF search and advise as bed is secured.    Eduard Clos, MSW, [place]

## 2014-03-16 NOTE — Clinical Social Work Psychosocial (Signed)
Clinical Social Work Department BRIEF PSYCHOSOCIAL ASSESSMENT 03/16/2014  Patient:  Colleen Jefferson, Colleen Jefferson     Account Number:  [id number]     Admit date:  03/15/2014  Clinical Social Worker:  Daiva Huge  Date/Time:  03/16/2014 12:23 PM  Referred by:  Physician  Date Referred:  03/16/2014 Referred for  SNF Placement   Other Referral:   Interview type:  Patient Other interview type:    PSYCHOSOCIAL DATA Living Status:  HUSBAND Admitted from facility:   Level of care:   Primary support name:  husband Primary support relationship to patient:  FAMILY Degree of support available:   good    CURRENT CONCERNS Current Concerns  Post-Acute Placement   Other Concerns:    SOCIAL WORK ASSESSMENT / PLAN CSW met with patient who reports she is recovering from total hip replacement-  she has wokred with therapy already this morning and is feeling good about her progress, however, feels a SNF rehab stay will be beneficial.  CSW discussed SNF process and she is open to this and interested in [facility] where she works-   Assessment/plan status:  Other - See comment Other assessment/ plan:   CSW will complete FL2 and PASARR for SNF search   Information/referral to community resources:   SNF list    PATIENT'S/FAMILY'S RESPONSE TO PLAN OF CARE: Patient reports that she works at [employer] and would like to pursue this at d.c for rehab-  She is familiar with the SNF process and feels this will be a good transition for her before returning home with her husband.  CSW will begin SNF search and advise as bed is secured.       Eduard Clos, MSW, [place]

## 2014-03-16 NOTE — Progress Notes (Addendum)
Physical Therapy Treatment Patient Details Name: BANDI RENSLOW MRN: [id number] DOB: Jul 07, 1956 Today's Date: 03/16/2014    History of Present Illness 58 yo female s/p R THA 03/15/14.     PT Comments    Increased pain this session-pt rated 8/10 however she was still able to participate. Will need SNF  Follow Up Recommendations  SNF     Equipment Recommendations  None recommended by PT    Recommendations for Other Services OT consult     Precautions / Restrictions Precautions Precautions: Fall; Posterior Verbally reviewed hip precautions. Pt only able to recall 1/3 Restrictions Weight Bearing Restrictions: Yes RLE Weight Bearing: Partial weight bearing RLE Partial Weight Bearing Percentage or Pounds: 50    Mobility  Bed Mobility Overal bed mobility: Needs Assistance Bed Mobility: Supine to Sit;Sit to Supine     Supine to sit: Min assist Sit to supine: Min assist   General bed mobility comments: assist for R LE. VCs safety, technique.   Transfers Overall transfer level: Needs assistance Equipment used: Rolling walker (2 wheeled) Transfers: Sit to/from Stand Sit to Stand: Min assist         General transfer comment: assist to rise, stabilize, control descent. VCs safety, technique, hand placement  Ambulation/Gait Ambulation/Gait assistance: Min assist Ambulation Distance (Feet): 60 Feet Assistive device: Rolling walker (2 wheeled) Gait Pattern/deviations: Step-to pattern;Antalgic;Decreased stride length     General Gait Details: slow gait speed. VCs safety, technique, sequence, adherence to PWB status.    Stairs            Wheelchair Mobility    Modified Rankin (Stroke Patients Only)       Balance                                    Cognition Arousal/Alertness: Awake/alert Behavior During Therapy: WFL for tasks assessed/performed Overall Cognitive Status: Within Functional Limits for tasks assessed                      Exercises Total Joint Exercises Ankle Circles/Pumps: AROM;Both;10 reps;Seated Quad Sets: AROM;Both;10 reps;Seated Heel Slides: AAROM;Right;10 reps;Seated Hip ABduction/ADduction: AAROM;Right;10 reps;Seated    General Comments        Pertinent Vitals/Pain 8/10 R hip. Pt pre-medicated prior to session. Ice applied at end of session    Home Living Family/patient expects to be discharged to:: Skilled nursing facility               Additional Comments: pt is a Charity fundraiser at [employer].  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  PT Goal Formulation: With patient Time For Goal Achievement: 03/30/14 Potential to Achieve Goals: Good Progress towards PT goals: Progressing toward goals (slowly)    Frequency  7X/week    PT Plan Current plan remains appropriate    Co-evaluation             End of Session Equipment Utilized During Treatment: Gait belt Activity Tolerance: Patient limited by pain Patient left: in bed;with call bell/phone within reach;with family/visitor present     Time: 7076-1518 PT Time Calculation (min): 20 min  Charges:  $Gait Training: 8-22 mins $Therapeutic Exercise: 8-22 mins  G Codes:      Weston Anna, MPT Pager: [phone]

## 2014-03-16 NOTE — Plan of Care (Signed)
Problem: Phase I Progression Outcomes Goal: Dangle or out of bed evening of surgery Outcome: Not Met (add Reason) Patient on bedrest till am per MD order.

## 2014-03-16 NOTE — Op Note (Signed)
Colleen Jefferson, DONNEL             ACCOUNT NO.:  [id number]  MEDICAL RECORD NO.:  [id number]  LOCATION:  1612                         FACILITY:  [facility]  PHYSICIAN:  Georges Lynch. Claryssa Sandner, M.D.DATE OF BIRTH:  [birth year]  DATE OF PROCEDURE:  03/15/2014 DATE OF DISCHARGE:                              OPERATIVE REPORT   SURGEON:  Georges Lynch. Darrelyn Hillock, M.D.  OPERATIVE ASSISTANT:  Marlowe Kays, M.D. and Kyle, [place].  PREOPERATIVE DIAGNOSES: 1. Severe flexion contracture, right hip. 2. Severe degenerative arthritis, right hip.  POSTOPERATIVE DIAGNOSES: 1. Severe flexion contracture, right hip. 2. Severe degenerative arthritis, right hip.  OPERATION:  Right total hip arthroplasty.  I utilized a Radio producer size 2.  The ball was a metal ball size 36 mm diameter, -2.  The cup size 52 mm cup with a polyethylene insert.  We utilized 1 screw for fixation of the cup and a hole eliminator.  PROCEDURE:  Under general anesthesia, the patient on left side, right side up, routine orthopedic prepping and draping of the right hip was carried out.  She had 2 g of IV Ancef.  The appropriate time-out was first carried out.  I also marked the appropriate right leg in the holding area.  The posterolateral approach to hip was carried out. Bleeders were identified and cauterized.  I then went down, incised the iliotibial band.  I then went down and did a partial stripping of the external rotators.  I then did a capsulectomy, dislocated the head, amputated the femoral head at the appropriate neck level.  At this time, the box osteotome was used followed by the widening reamer and then utilized the canal finder.  Once we had the canal established, I then rasped up to a size 2 Tri-Lock stem.  After that was prepared, we then thoroughly irrigated out the femoral canal, packed the canal with the packing and was later removed.  We then completed the capsulectomy and did an osteotomy of the osteophytes  around the acetabulum.  I reamed the acetabulum up to 51 for a 52 mm cup.  At this time, a 52-mm cup was inserted one screw was used after we made the appropriate drill hole. We utilized 30-mm screw for fixation and manhole cover then was inserted.  Following that, I thoroughly irrigated out the area and after we noted the appropriate position of the cup, I then inserted a polyethylene cup.  Following that, we then completed our rasping of the femoral canal after removal of the packing and inserted a size 2 Tri- Lock stem.  We had a very secure fit.  We then went through trials and finally selected a -2 ball 36-mm diameter.  We reduced that into the acetabulum, had excellent range of motion, excellent stability.  Thoroughly irrigated out the area.  I injected a mixture of 20 mL of bupivacaine with 20 mL of saline.  I then packed the inferior aspect of the acetabulum with thrombin-soaked Gelfoam.  I inserted Hemovac drain and closed the wound layers in usual fashion.          ______________________________ Georges Lynch Darrelyn Hillock, M.D.     RAG/MEDQ  D:  03/15/2014  T:  03/16/2014  Job:  [id number]

## 2014-03-16 NOTE — Evaluation (Signed)
Occupational Therapy Evaluation Patient Details Name: Colleen Jefferson MRN: [id number] DOB: 1956-05-21 Today's Date: 03/16/2014    History of Present Illness 58 yo female s/p R THA 03/15/14.    Clinical Impression   This 58 year old female was admitted for R THA, posterior approach.  She has posterior THPs and is PWB.  She will benefit from skilled OT to increase safety and independence with adls.  Prior to admission, she was independent.  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  Goals in acute are for supervision to min A for adls.      Follow Up Recommendations  SNF    Equipment Recommendations  3 in 1 bedside comode    Recommendations for Other Services       Precautions / Restrictions Precautions Precautions: Fall Restrictions Weight Bearing Restrictions: Yes RLE Weight Bearing: Partial weight bearing RLE Partial Weight Bearing Percentage or Pounds: 50      Mobility Bed Mobility Overal bed mobility: Needs Assistance Bed Mobility: Supine to Sit;Sit to Supine     Supine to sit: Min assist Sit to supine: Min assist   General bed mobility comments: assist for R LE. VCs safety, technique.   Transfers Overall transfer level: Needs assistance Equipment used: Rolling walker (2 wheeled) Transfers: Sit to/from Stand Sit to Stand: Min assist;From elevated surface         General transfer comment: assist to rise, stabilize, control descent. VCs safety, technique, hand placement    Balance                                            ADL Overall ADL's : Needs assistance/impaired     Grooming: Set up;Wash/dry hands;Sitting   Upper Body Bathing: Set up;Sitting   Lower Body Bathing: Minimal assistance;Sit to/from stand;With adaptive equipment;Adhering to hip precautions   Upper Body Dressing : Set up;Sitting   Lower Body Dressing: Moderate assistance;Adhering to hip precautions;With adaptive equipment;Sit  to/from stand   Toilet Transfer: Minimal assistance;BSC;Stand-pivot   Toileting- Clothing Manipulation and Hygiene: Minimal assistance;Sit to/from stand;Adhering to hip precautions         General ADL Comments: Pt performed SPT to 3;1 commode.  Limited by pain.  Educated on AE and pt practiced with sock aid on non-surgical foot.  Pt has seen but had not used AE previously.  Educated on THPs and ADLs     Vision                     Perception     Praxis      Pertinent Vitals/Pain R hip high pain with movement; repositioned with ice and requested pain medication     Hand Dominance     Extremity/Trunk Assessment Upper Extremity Assessment Upper Extremity Assessment: Overall WFL for tasks assessed      Cervical / Trunk Assessment Cervical / Trunk Assessment: Normal   Communication Communication Communication: No difficulties   Cognition Arousal/Alertness: Awake/alert Behavior During Therapy: WFL for tasks assessed/performed Overall Cognitive Status: Within Functional Limits for tasks assessed                     General Comments       Exercises       Shoulder Instructions      Home Living Family/patient expects to be discharged  to:: Skilled nursing facility                                 Additional Comments: 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  OT Goal Formulation: With  patient Time For Goal Achievement: 03/23/14 Potential to Achieve Goals: Good ADL Goals Pt Will Perform Lower Body Bathing: with supervision;with adaptive equipment;sit to/from stand Pt Will Perform Lower Body Dressing: with adaptive equipment;sit to/from stand;with min assist Pt Will Transfer to Toilet: with min guard assist;ambulating;bedside commode Pt Will Perform Toileting - Clothing Manipulation and hygiene: with supervision;sit to/from stand  OT Frequency: Min 2X/week   Barriers to D/C:            Co-evaluation              End of Session    Activity Tolerance: Patient tolerated treatment well Patient left: in bed;with call bell/phone within reach;with family/visitor present   Time: 6244-6950 OT Time Calculation (min): 18 min Charges:  OT General Charges $OT Visit: 1 Procedure OT Evaluation $Initial OT Evaluation Tier I: 1 Procedure OT Treatments $Self Care/Home Management : 8-22 mins G-Codes:    Marica Otter Mar 29, 2014, 3:46 PM   Marica Otter, OTR/L [phone] 03-29-2014

## 2014-03-16 NOTE — Progress Notes (Addendum)
   Subjective: 1 Day Post-Op Procedure(s) (LRB): RIGHT TOTAL HIP ARTHROPLASTY (Right) Patient reports pain as mild.   Patient seen in rounds with Dr. Darrelyn Hillock. Patient is well, and has had no acute complaints or problems. She had some issues with hypotension last night but is doing better this morning. No SOB or chest pain. Reports that she has felt fatigued but overall "doing ok".  We will start therapy today.  Plan is to go Home after hospital stay.  Objective: Vital signs in last 24 hours: Temp:  [96.4 F (35.8 C)-98.7 F (37.1 C)] 98.7 F (37.1 C) (06/03 0545) Pulse Rate:  [80-107] 93 (06/03 0545) Resp:  [12-16] 16 (06/03 0545) BP: (96-141)/(60-76) 110/62 mmHg (06/03 0545) SpO2:  [96 %-100 %] 97 % (06/03 0545) Weight:  [73.573 kg (162 lb 3.2 oz)] 73.573 kg (162 lb 3.2 oz) (06/02 1700)  Intake/Output from previous day:  Intake/Output Summary (Last 24 hours) at 03/16/14 0818 Last data filed at 03/16/14 0546  Gross per 24 hour  Intake 5906.67 ml  Output   4090 ml  Net 1816.67 ml     Labs:  Recent Labs  03/16/14 0739  HGB 9.1*    Recent Labs  03/16/14 0739  WBC 12.2*  RBC 2.80*  HCT 25.1*  PLT 211    Recent Labs  03/16/14 0435  NA 137  K 4.1  CL 102  CO2 23  BUN 10  CREATININE 0.46*  GLUCOSE 135*  CALCIUM 8.8    EXAM General - Patient is Alert and Oriented Extremity - Neurologically intact Intact pulses distally Dorsiflexion/Plantar flexion intact Compartment soft Dressing - dressing C/D/I Motor Function - intact, moving foot and toes well on exam.  Hemovac pulled without difficulty.  Past Medical History  Diagnosis Date  . 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 Anxiety   . Arthritis   . Bleeds easily     when blood drawn  . [facility] spotted fever 5 yrs ago    Assessment/Plan: 1 Day Post-Op Procedure(s) (LRB): RIGHT TOTAL HIP ARTHROPLASTY (Right) Active Problems:   Osteoarthritis of right hip  Postoperative acute blood loss  anemia  Estimated body mass index is 30.66 kg/(m^2) as calculated from the following:   Height as of this encounter: 5\' 1"  (1.549 m).   Weight as of this encounter: 73.573 kg (162 lb 3.2 oz). Advance diet Up with therapy  DVT Prophylaxis - Xarelto PWB 50% D/C Knee Immobilizer once hip precautions discussed Hemovac Pulled Begin Therapy   Will continue to monitor BP and Hgb. Start therapy today.   Dimitri Ped, PA-C Orthopaedic Surgery 03/16/2014, 8:18 AM

## 2014-03-16 NOTE — Evaluation (Addendum)
Physical Therapy Evaluation Patient Details Name: Colleen Jefferson MRN: [id number] DOB: [birth year] Today's Date: 03/16/2014   History of Present Illness  57 yo female s/p R THA 03/15/14.   Clinical Impression  On eval, pt required Min assist for mobility-able to ambulate ~60 feet with walker. Pt may need ST rehab at SNF prior to returning home.     Follow Up Recommendations SNF (if pt is agreeable. Pt discussed this with Dr. Darrelyn Hillock this am.)    Equipment Recommendations  None recommended by PT    Recommendations for Other Services OT consult     Precautions / Restrictions Precautions Precautions: Fall; Posterior Hip Restrictions Weight Bearing Restrictions: Yes RLE Weight Bearing: Partial weight bearing RLE Partial Weight Bearing Percentage or Pounds: 50      Mobility  Bed Mobility Overal bed mobility: Needs Assistance Bed Mobility: Supine to Sit;Sit to Supine     Supine to sit: Min assist Sit to supine: Min assist   General bed mobility comments: assist for R LE. VCs safety, technique.   Transfers Overall transfer level: Needs assistance Equipment used: Rolling walker (2 wheeled) Transfers: Sit to/from Stand Sit to Stand: Min assist;From elevated surface         General transfer comment: assist to rise, stabilize, control descent. VCs safety, technique, hand placement  Ambulation/Gait Ambulation/Gait assistance: Min assist Ambulation Distance (Feet): 60 Feet Assistive device: Rolling walker (2 wheeled) Gait Pattern/deviations: Step-to pattern;Decreased stride length;Antalgic     General Gait Details: slow gait speed. VCs safety, technique, sequence, adherence to PWB status.   Stairs            Wheelchair Mobility    Modified Rankin (Stroke Patients Only)       Balance                                             Pertinent Vitals/Pain 5/10 R hip. Ice applied end of session    Home Living Family/patient expects to be  discharged to:: Private residence Living Arrangements: Spouse/significant other   Type of Home: House Home Access: Stairs to enter Entrance Stairs-Rails: None Entrance Stairs-Number of Steps: 2 Home Layout: Able to live on main level with bedroom/bathroom;One level Home Equipment: Cane - single point;Walker - 2 wheels      Prior Function Level of Independence: Independent with assistive device(s)         Comments: used cane when walking outside     Hand Dominance        Extremity/Trunk Assessment   Upper Extremity Assessment: Overall WFL for tasks assessed           Lower Extremity Assessment: RLE deficits/detail RLE Deficits / Details: hip flex 2/5, hip abd/add 2/5, moves ankle well.     Cervical / Trunk Assessment: Normal  Communication   Communication: No difficulties  Cognition Arousal/Alertness: Awake/alert Behavior During Therapy: WFL for tasks assessed/performed Overall Cognitive Status: Within Functional Limits for tasks assessed                      General Comments      Exercises Total Joint Exercises Ankle Circles/Pumps: AROM;Both;10 reps;Seated Quad Sets: AROM;Both;10 reps;Seated Heel Slides: AAROM;Right;10 reps;Seated Hip ABduction/ADduction: AAROM;Right;10 reps;Seated      Assessment/Plan    PT Assessment Patient needs continued PT services  PT Diagnosis Difficulty walking;Abnormality of gait;Acute pain   PT  Problem List Decreased strength;Decreased range of motion;Decreased activity tolerance;Decreased balance;Decreased mobility;Pain;Decreased knowledge of precautions;Decreased knowledge of use of DME  PT Treatment Interventions DME instruction;Gait training;Functional mobility training;Therapeutic activities;Therapeutic exercise;Patient/family education;Balance training   PT Goals (Current goals can be found in the Care Plan section) Acute Rehab PT Goals Patient Stated Goal: regain independence.  PT Goal Formulation: With  patient Time For Goal Achievement: 03/30/14 Potential to Achieve Goals: Good    Frequency 7X/week   Barriers to discharge        Co-evaluation               End of Session Equipment Utilized During Treatment: Gait belt Activity Tolerance: Patient tolerated treatment well Patient left: in bed;with call bell/phone within reach           Time: 1027-1057 PT Time Calculation (min): 30 min   Charges:   PT Evaluation $Initial PT Evaluation Tier I: 1 Procedure PT Treatments $Gait Training: 8-22 mins $Therapeutic Exercise: 8-22 mins   PT G Codes:          Gershon MusselJannie Shemere Shoaib Siefker 03/16/2014, 12:29 PM

## 2014-03-17 DIAGNOSIS — D62 Acute posthemorrhagic anemia: Secondary | ICD-10-CM | POA: Diagnosis not present

## 2014-03-17 LAB — CBC
HEMATOCRIT: 22 % — AB (ref 36.0–46.0)
Hemoglobin: 8 g/dL — ABNORMAL LOW (ref 12.0–15.0)
MCH: 33.5 pg (ref 26.0–34.0)
MCHC: 36.4 g/dL — ABNORMAL HIGH (ref 30.0–36.0)
MCV: 92.1 fL (ref 78.0–100.0)
Platelets: 179 10*3/uL (ref 150–400)
RBC: 2.39 MIL/uL — ABNORMAL LOW (ref 3.87–5.11)
RDW: 13.4 % (ref 11.5–15.5)
WBC: 11.7 10*3/uL — AB (ref 4.0–10.5)

## 2014-03-17 LAB — BASIC METABOLIC PANEL
BUN: 14 mg/dL (ref 6–23)
CHLORIDE: 100 meq/L (ref 96–112)
CO2: 28 mEq/L (ref 19–32)
Calcium: 8.6 mg/dL (ref 8.4–10.5)
Creatinine, Ser: 0.49 mg/dL — ABNORMAL LOW (ref 0.50–1.10)
GFR calc Af Amer: >90 mL/min (ref >90)
GFR calc non Af Amer: >90 mL/min (ref >90)
GLUCOSE: 136 mg/dL — AB (ref 70–99)
POTASSIUM: 3.9 meq/L (ref 3.7–5.3)
Sodium: 138 mEq/L (ref 137–147)

## 2014-03-17 LAB — HEMOGLOBIN AND HEMATOCRIT, BLOOD
HCT: 26.1 % — ABNORMAL LOW (ref 36.0–46.0)
Hemoglobin: 9.3 g/dL — ABNORMAL LOW (ref 12.0–15.0)

## 2014-03-17 NOTE — Clinical Documentation Improvement (Signed)
THIS DOCUMENT IS NOT A PERMANENT PART OF THE MEDICAL RECORD  Noted significant drop in H/H: 5/20 15.1/42.5,  6/3 9.1/25.1,  6/4 8.0/22.0. Please address in Notes to reflect severity of illness and risk of mortality. Thank you.  Possible Clinical Conditions?  - Expected Acute Blood Loss Anemia - Acute Blood Loss Anemia - Acute on chronic blood loss anemia-  - Chronic blood loss anemia - Precipitous drop in Hematocrit-  - Other Condition  Reviewed: additional documentation in the medical record  Thank Saintclair Halsted  Clinical Documentation Specialist: [phone] Health Information Management 

## 2014-03-17 NOTE — Progress Notes (Signed)
Physical Therapy Treatment Patient Details Name: Colleen Jefferson MRN: [id number] DOB: [birth year] Today's Date: 03/17/2014    History of Present Illness 58 yo female s/p R THA 03/15/14.     PT Comments    Progressing slowly with mobility. Plan is possibly for d/c to SNF on tomorrow.   Follow Up Recommendations  SNF     Equipment Recommendations  None recommended by PT    Recommendations for Other Services OT consult     Precautions / Restrictions Precautions Precautions: Fall Restrictions Weight Bearing Restrictions: Yes RLE Weight Bearing: Partial weight bearing RLE Partial Weight Bearing Percentage or Pounds: 50    Mobility  Bed Mobility               General bed mobility comments: pt up ambulating with NT  Transfers Overall transfer level: Needs assistance Equipment used: Rolling walker (2 wheeled) Transfers: Sit to/from Stand Sit to Stand: Min assist         General transfer comment: assist to control descent. VCs safety, technique, hand placement, R LE placement  Ambulation/Gait Ambulation/Gait assistance: Min guard Ambulation Distance (Feet): 85 Feet Assistive device: Rolling walker (2 wheeled) Gait Pattern/deviations: Step-to pattern;Step-through pattern;Decreased stride length;Antalgic;Decreased step length - right     General Gait Details: slow gait speed. VCs safety, technique, sequence, adherence to PWB status.    Stairs            Wheelchair Mobility    Modified Rankin (Stroke Patients Only)       Balance                                    Cognition Arousal/Alertness: Awake/alert Behavior During Therapy: WFL for tasks assessed/performed Overall Cognitive Status: Within Functional Limits for tasks assessed                      Exercises      General Comments        Pertinent Vitals/Pain 6/10 R hip with activity. Ice applied end of session    Home Living                      Prior  Function            PT Goals (current goals can now be found in the care plan section) Progress towards PT goals: Progressing toward goals    Frequency  7X/week    PT Plan Current plan remains appropriate    Co-evaluation             End of Session   Activity Tolerance: Patient limited by pain;Patient limited by fatigue Patient left: in chair;with call bell/phone within reach     Time: 1035-1046 PT Time Calculation (min): 11 min  Charges:  $Gait Training: 8-22 mins                    G Codes:      Rebeca Alert, MPT Pager: [phone]

## 2014-03-17 NOTE — Clinical Social Work Note (Signed)
SNF bed selected at [facility] where patient works as a Engineer, civil (consulting)- she is pleased they can accept her and I have advised them of tentative dc date of 03/18/14. CSW will f/u tomorrow to further assist with d/c- patient joked with CSW about being on the other side (from Nurse to patient) at the SNF-  Reece Levy, MSW, [employer] [phone]

## 2014-03-17 NOTE — Progress Notes (Signed)
Physical Therapy Treatment Patient Details Name: BEEBE COUNCE MRN: [id number] DOB: 04/17/1956 Today's Date: 03/17/2014    History of Present Illness 58 yo female s/p R THA 03/15/14.     PT Comments    Progressing with mobility.   Follow Up Recommendations  SNF     Equipment Recommendations  None recommended by PT    Recommendations for Other Services OT consult     Precautions / Restrictions Precautions Precautions: Fall Restrictions Weight Bearing Restrictions: Yes RLE Weight Bearing: Partial weight bearing RLE Partial Weight Bearing Percentage or Pounds: 50    Mobility  Bed Mobility Overal bed mobility: Needs Assistance Bed Mobility: Sit to Supine       Sit to supine: Min assist   General bed mobility comments: Assist for R LE onto bed. Pt used trapeze.   Transfers Overall transfer level: Needs assistance Equipment used: Rolling walker (2 wheeled) Transfers: Sit to/from Stand Sit to Stand: Min assist         General transfer comment: assist to control descent. VCs safety, technique, hand placement, R LE placement  Ambulation/Gait Ambulation/Gait assistance: Min guard Ambulation Distance (Feet): 100 Feet Assistive device: Rolling walker (2 wheeled) Gait Pattern/deviations: Step-to pattern;Step-through pattern;Decreased stride length;Antalgic;Decreased step length - left     General Gait Details: slow gait speed. VCs safety, technique, sequence, adherence to PWB status.    Stairs            Wheelchair Mobility    Modified Rankin (Stroke Patients Only)       Balance                                    Cognition                            Exercises Total Joint Exercises Ankle Circles/Pumps: AROM;Both;10 reps;Seated Quad Sets: AROM;Both;10 reps;Seated Heel Slides: AAROM;Right;10 reps;Seated Hip ABduction/ADduction: AAROM;Right;10 reps;Seated    General Comments        Pertinent Vitals/Pain R hip 6/10  with activity. Ice applied end of session.     Home Living                      Prior Function            PT Goals (current goals can now be found in the care plan section) Progress towards PT goals: Progressing toward goals    Frequency  7X/week    PT Plan Current plan remains appropriate    Co-evaluation             End of Session   Activity Tolerance: Patient limited by fatigue;Patient limited by pain Patient left: in bed;with call bell/phone within reach;with family/visitor present     Time: 8841-6606 PT Time Calculation (min): 26 min  Charges:  $Gait Training: 8-22 mins $Therapeutic Exercise: 8-22 mins                    G Codes:      Rebeca Alert, MPT Pager: [phone]

## 2014-03-18 LAB — CBC
HEMATOCRIT: 23.6 % — AB (ref 36.0–46.0)
Hemoglobin: 8.3 g/dL — ABNORMAL LOW (ref 12.0–15.0)
MCH: 32.5 pg (ref 26.0–34.0)
MCHC: 35.2 g/dL (ref 30.0–36.0)
MCV: 92.5 fL (ref 78.0–100.0)
Platelets: 211 10*3/uL (ref 150–400)
RBC: 2.55 MIL/uL — AB (ref 3.87–5.11)
RDW: 13.3 % (ref 11.5–15.5)
WBC: 14.1 10*3/uL — AB (ref 4.0–10.5)

## 2014-03-18 MED ORDER — OXYCODONE-ACETAMINOPHEN 5-325 MG PO TABS: 12.0000 | ORAL_TABLET | ORAL | Status: DC | PRN

## 2014-03-18 MED ORDER — METHOCARBAMOL 500 MG PO TABS: 500.0000 mg | ORAL_TABLET | Freq: Four times a day (QID) | ORAL | Status: DC | PRN

## 2014-03-18 MED ORDER — BISACODYL 10 MG RE SUPP
10.0000 mg | Freq: Once | RECTAL | Status: AC
  Administered 2014-03-18: 10 mg via RECTAL
  Filled 2014-03-18: qty 1

## 2014-03-18 MED ORDER — FERROUS SULFATE 325 (65 FE) MG PO TABS: 325.0000 mg | ORAL_TABLET | Freq: Three times a day (TID) | ORAL | Status: DC

## 2014-03-18 MED ORDER — DSS 100 MG PO CAPS: 100.0000 mg | ORAL_CAPSULE | Freq: Two times a day (BID) | ORAL | Status: DC

## 2014-03-18 MED ORDER — RIVAROXABAN 10 MG PO TABS: 10.0000 mg | ORAL_TABLET | Freq: Every day | ORAL | Status: DC

## 2014-03-18 NOTE — Discharge Summary (Signed)
Physician Discharge Summary   Patient ID: Colleen Jefferson MRN: [id number] DOB/AGE: 60-Jan-1957 3 y.o.  Admit date: 03/15/2014 Discharge date: 03/18/2014  Primary Diagnosis: Osteoarthritis, right hip  Admission Diagnoses:  Past Medical History  Diagnosis Date  . 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 Anxiety   . Arthritis   . Bleeds easily     when blood drawn  . [place] spotted fever 5 yrs ago   Discharge Diagnoses:   Active Problems:   Osteoarthritis of right hip   Acute blood loss anemia  S/P right total hip arthroplasty  Estimated body mass index is 30.66 kg/(m^2) as calculated from the following:   Height as of this encounter: 5' 1" (1.549 m).   Weight as of this encounter: 73.573 kg (162 lb 3.2 oz).  Procedure(s) (LRB): RIGHT TOTAL HIP ARTHROPLASTY (Right)   Consults: None  HPI: Colleen Jefferson, 58 y.o. female, has a history of pain and functional disability in the right hip(s) due to arthritis and patient has failed non-surgical conservative treatments for greater than 12 weeks to include NSAID's and/or analgesics, flexibility and strengthening excercises and activity modification. Onset of symptoms was gradual starting 5 years ago with gradually worsening course since that time.The patient noted no past surgery on the right hip(s). Patient currently rates pain in the right hip at 8 out of 10 with activity. Patient has night pain, worsening of pain with activity and weight bearing, pain that interfers with activities of daily living, pain with passive range of motion and crepitus. Patient has evidence of periarticular osteophytes and joint space narrowing by imaging studies. This condition presents safety issues increasing the risk of falls. There is no current active infection.     Laboratory Data: Admission on 03/15/2014  Component Date Value Ref Range Status  . ABO/RH(D) 03/15/2014 A POS   Final  . Antibody Screen 03/15/2014 NEG   Final  . Sample Expiration 03/15/2014  03/18/2014   Final  . Unit Number 03/15/2014 [id number]   Final  . Blood Component Type 03/15/2014 RED CELLS,LR   Final  . Unit division 03/15/2014 00   Final  . Status of Unit 03/15/2014 ALLOCATED   Final  . Transfusion Status 03/15/2014 OK TO TRANSFUSE   Final  . Crossmatch Result 03/15/2014 Compatible   Final  . Unit Number 03/15/2014 [id number]   Final  . Blood Component Type 03/15/2014 RED CELLS,LR   Final  . Unit division 03/15/2014 00   Final  . Status of Unit 03/15/2014 ALLOCATED   Final  . Transfusion Status 03/15/2014 OK TO TRANSFUSE   Final  . Crossmatch Result 03/15/2014 Compatible   Final  . ABO/RH(D) 03/15/2014 A POS   Final  . Sodium 03/16/2014 137  137 - 147 mEq/L Final  . Potassium 03/16/2014 4.1  3.7 - 5.3 mEq/L Final  . Chloride 03/16/2014 102  96 - 112 mEq/L Final  . CO2 03/16/2014 23  19 - 32 mEq/L Final  . Glucose, Bld 03/16/2014 135* 70 - 99 mg/dL Final  . BUN 03/16/2014 10  6 - 23 mg/dL Final  . Creatinine, Ser 03/16/2014 0.46* 0.50 - 1.10 mg/dL Final  . Calcium 03/16/2014 8.8  8.4 - 10.5 mg/dL Final  . GFR calc non Af Amer 03/16/2014 >90  >90 mL/min Final  . GFR calc Af Amer 03/16/2014 >90  >90 mL/min Final   Comment: (NOTE)  The eGFR has been calculated using the CKD EPI equation.                          This calculation has not been validated in all clinical situations.                          eGFR's persistently <90 mL/min signify possible Chronic Kidney                          Disease.  . WBC 03/16/2014 12.2* 4.0 - 10.5 K/uL Final  . RBC 03/16/2014 2.80* 3.87 - 5.11 MIL/uL Final  . Hemoglobin 03/16/2014 9.1* 12.0 - 15.0 g/dL Final  . HCT 03/16/2014 25.1* 36.0 - 46.0 % Final  . MCV 03/16/2014 89.6  78.0 - 100.0 fL Final  . MCH 03/16/2014 32.5  26.0 - 34.0 pg Final  . MCHC 03/16/2014 36.3* 30.0 - 36.0 g/dL Final  . RDW 03/16/2014 13.0  11.5 - 15.5 % Final  . Platelets 03/16/2014 211  150 - 400 K/uL Final  . WBC  03/17/2014 11.7* 4.0 - 10.5 K/uL Final  . RBC 03/17/2014 2.39* 3.87 - 5.11 MIL/uL Final  . Hemoglobin 03/17/2014 8.0* 12.0 - 15.0 g/dL Final  . HCT 03/17/2014 22.0* 36.0 - 46.0 % Final  . MCV 03/17/2014 92.1  78.0 - 100.0 fL Final  . MCH 03/17/2014 33.5  26.0 - 34.0 pg Final  . MCHC 03/17/2014 36.4* 30.0 - 36.0 g/dL Final  . RDW 03/17/2014 13.4  11.5 - 15.5 % Final  . Platelets 03/17/2014 179  150 - 400 K/uL Final  . Sodium 03/17/2014 138  137 - 147 mEq/L Final  . Potassium 03/17/2014 3.9  3.7 - 5.3 mEq/L Final  . Chloride 03/17/2014 100  96 - 112 mEq/L Final  . CO2 03/17/2014 28  19 - 32 mEq/L Final  . Glucose, Bld 03/17/2014 136* 70 - 99 mg/dL Final  . BUN 03/17/2014 14  6 - 23 mg/dL Final  . Creatinine, Ser 03/17/2014 0.49* 0.50 - 1.10 mg/dL Final  . Calcium 03/17/2014 8.6  8.4 - 10.5 mg/dL Final  . GFR calc non Af Amer 03/17/2014 >90  >90 mL/min Final  . GFR calc Af Amer 03/17/2014 >90  >90 mL/min Final   Comment: (NOTE)                          The eGFR has been calculated using the CKD EPI equation.                          This calculation has not been validated in all clinical situations.                          eGFR's persistently <90 mL/min signify possible Chronic Kidney                          Disease.  Marland Kitchen Hemoglobin 03/17/2014 9.3* 12.0 - 15.0 g/dL Final  . HCT 03/17/2014 26.1* 36.0 - 46.0 % Final  . WBC 03/18/2014 14.1* 4.0 - 10.5 K/uL Final  . RBC 03/18/2014 2.55* 3.87 - 5.11 MIL/uL Final  . Hemoglobin 03/18/2014 8.3* 12.0 - 15.0 g/dL Final  . HCT 03/18/2014 23.6* 36.0 - 46.0 % Final  .  MCV 03/18/2014 92.5  78.0 - 100.0 fL Final  . MCH 03/18/2014 32.5  26.0 - 34.0 pg Final  . MCHC 03/18/2014 35.2  30.0 - 36.0 g/dL Final  . RDW 03/18/2014 13.3  11.5 - 15.5 % Final  . Platelets 03/18/2014 211  150 - 400 K/uL Final  Hospital Outpatient Visit on 03/02/2014  Component Date Value Ref Range Status  . aPTT 03/02/2014 31  24 - 37 seconds Final  . Sodium 03/02/2014 142   137 - 147 mEq/L Final  . Potassium 03/02/2014 4.2  3.7 - 5.3 mEq/L Final  . Chloride 03/02/2014 101  96 - 112 mEq/L Final  . CO2 03/02/2014 26  19 - 32 mEq/L Final  . Glucose, Bld 03/02/2014 84  70 - 99 mg/dL Final  . BUN 03/02/2014 15  6 - 23 mg/dL Final  . Creatinine, Ser 03/02/2014 0.51  0.50 - 1.10 mg/dL Final  . Calcium 03/02/2014 10.3  8.4 - 10.5 mg/dL Final  . Total Protein 03/02/2014 7.7  6.0 - 8.3 g/dL Final  . Albumin 03/02/2014 4.4  3.5 - 5.2 g/dL Final  . AST 03/02/2014 20  0 - 37 U/L Final  . ALT 03/02/2014 22  0 - 35 U/L Final  . Alkaline Phosphatase 03/02/2014 68  39 - 117 U/L Final  . Total Bilirubin 03/02/2014 0.4  0.3 - 1.2 mg/dL Final  . GFR calc non Af Amer 03/02/2014 >90  >90 mL/min Final  . GFR calc Af Amer 03/02/2014 >90  >90 mL/min Final   Comment: (NOTE)                          The eGFR has been calculated using the CKD EPI equation.                          This calculation has not been validated in all clinical situations.                          eGFR's persistently <90 mL/min signify possible Chronic Kidney                          Disease.  Marland Kitchen Prothrombin Time 03/02/2014 13.2  11.6 - 15.2 seconds Final  . INR 03/02/2014 1.02  0.00 - 1.49 Final  . Color, Urine 03/02/2014 YELLOW  YELLOW Final  . APPearance 03/02/2014 CLEAR  CLEAR Final  . Specific Gravity, Urine 03/02/2014 1.018  1.005 - 1.030 Final  . pH 03/02/2014 6.0  5.0 - 8.0 Final  . Glucose, UA 03/02/2014 NEGATIVE  NEGATIVE mg/dL Final  . Hgb urine dipstick 03/02/2014 NEGATIVE  NEGATIVE Final  . Bilirubin Urine 03/02/2014 NEGATIVE  NEGATIVE Final  . Ketones, ur 03/02/2014 NEGATIVE  NEGATIVE mg/dL Final  . Protein, ur 03/02/2014 NEGATIVE  NEGATIVE mg/dL Final  . Urobilinogen, UA 03/02/2014 0.2  0.0 - 1.0 mg/dL Final  . Nitrite 03/02/2014 NEGATIVE  NEGATIVE Final  . Leukocytes, UA 03/02/2014 NEGATIVE  NEGATIVE Final   MICROSCOPIC NOT DONE ON URINES WITH NEGATIVE PROTEIN, BLOOD, LEUKOCYTES, NITRITE,  OR GLUCOSE <1000 mg/dL.  . WBC 03/02/2014 6.8  4.0 - 10.5 K/uL Final  . RBC 03/02/2014 4.73  3.87 - 5.11 MIL/uL Final  . Hemoglobin 03/02/2014 15.1* 12.0 - 15.0 g/dL Final  . HCT 03/02/2014 42.5  36.0 - 46.0 % Final  . MCV 03/02/2014 89.9  78.0 - 100.0 fL Final  . MCH 03/02/2014 31.9  26.0 - 34.0 pg Final  . MCHC 03/02/2014 35.5  30.0 - 36.0 g/dL Final  . RDW 03/02/2014 12.8  11.5 - 15.5 % Final  . Platelets 03/02/2014 257  150 - 400 K/uL Final  . MRSA, PCR 03/02/2014 NEGATIVE  NEGATIVE Final  . Staphylococcus aureus 03/02/2014 NEGATIVE  NEGATIVE Final   Comment:                                 The Xpert SA Assay (FDA                          approved for NASAL specimens                          in patients over 33 years of age),                          is one component of                          a comprehensive surveillance                          program.  Test performance has                          been validated by [employer] for patients greater                          than or equal to 8 year old.                          It is not intended                          to diagnose infection nor to                          guide or monitor treatment.     X-Rays:Dg Chest 2 View  03/02/2014   CLINICAL DATA:  Preop for hip surgery  EXAM: CHEST  2 VIEW  COMPARISON:  None  FINDINGS: Cardiomediastinal silhouette is unremarkable. No acute infiltrate or pleural effusion. No pulmonary edema. Metallic fixation plate cervical spine. Minimal mid thoracic dextroscoliosis. Mild degenerative changes thoracic spine.  IMPRESSION: No active cardiopulmonary disease.   Electronically Signed   By: Lahoma Crocker M.D.   On: 03/02/2014 15:35   Dg Hip Portable 1 View Right  03/15/2014   CLINICAL DATA:  postop  EXAM: PORTABLE RIGHT HIP - 1 VIEW  COMPARISON:  None.  FINDINGS: Patient status post total right hip arthroplasty. Hardware and native osseous structures appear intact.  Postsurgical changes within the surrounding soft tissues. Degenerative changes within the right sacroiliac joint.  IMPRESSION: Patient is status post total right hip arthroplasty.   Electronically Signed   By: Margaree Mackintosh M.D.   On: 03/15/2014 15:42  EKG: Orders placed during the hospital encounter of 03/02/14  . EKG 12-LEAD  . EKG 12-LEAD     Hospital Course: Patient was admitted to [facility] and taken to the OR and underwent the above state procedure without complications.  Patient tolerated the procedure well and was later transferred to the recovery room and then to the orthopaedic floor for postoperative care.  They were given PO and IV analgesics for pain control following their surgery.  They were given 24 hours of postoperative antibiotics of  Anti-infectives   Start     Dose/Rate Route Frequency Ordered Stop   03/15/14 1830  ceFAZolin (ANCEF) IVPB 1 g/50 mL premix     1 g 100 mL/hr over 30 Minutes Intravenous Every 6 hours 03/15/14 1703 03/16/14 0048   03/15/14 1402  polymyxin B 500,000 Units, bacitracin 50,000 Units in sodium chloride irrigation 0.9 % 500 mL irrigation  Status:  Discontinued       As needed 03/15/14 1402 03/15/14 1510   03/15/14 1022  ceFAZolin (ANCEF) IVPB 2 g/50 mL premix     2 g 100 mL/hr over 30 Minutes Intravenous On call to O.R. 03/15/14 1022 03/15/14 1245     and started on DVT prophylaxis in the form of Xarelto.   PT and OT were ordered for total hip protocol.  The patient was allowed to be PWB 50% with therapy. Discharge planning was consulted to help with postop disposition and equipment needs.  Patient had a fair night on the evening of surgery.  Had some issues with hypotension but they resolved post op day two. They started to get up OOB with therapy on day one.  Hemovac drain was pulled without difficulty.  The knee immobilizer was removed and discontinued.  Continued to work with therapy into day two. By day three, the patient had  progressed with therapy and meeting their goals. She had developed ABLA but was asymptomatic.  Incision was healing well.  Patient was seen in rounds and was ready to go home.   Diet: Regular diet Activity:PWB 50% for one week then progress to WBAT No bending hip over 90 degrees- A "L" Angle Do not cross legs Do not let foot roll inward When turning these patients a pillow should be placed between the patient's legs to prevent crossing. Patients should have the affected knee fully extended when trying to sit or stand from all surfaces to prevent excessive hip flexion. When ambulating and turning toward the affected side the affected leg should have the toes turned out prior to moving the walker and the rest of patient's body as to prevent internal rotation/ turning in of the leg. Abduction pillows are the most effective way to prevent a patient from not crossing legs or turning toes in at rest. If an abduction pillow is not ordered placing a regular pillow length wise between the patient's legs is also an effective reminder. It is imperative that these precautions be maintained so that the surgical hip does not dislocate. Follow-up:in 2 weeks Disposition - Skilled nursing facility Discharged Condition: stable   Discharge Instructions   Call MD / Call 911    Complete by:  As directed   If you experience chest pain or shortness of breath, CALL 911 and be transported to the hospital emergency room.  If you develope a fever above 101 F, pus (white drainage) or increased drainage or redness at the wound, or calf pain, call your surgeon's office.     Constipation  Prevention    Complete by:  As directed   Drink plenty of fluids.  Prune juice may be helpful.  You may use a stool softener, such as Colace (over the counter) 100 mg twice a day.  Use MiraLax (over the counter) for constipation as needed.     Diet general    Complete by:  As directed      Discharge instructions    Complete by:  As  directed   Walk with your walker. Partial weightbearing right LE 50% for one week then progress to WBAT.  Change your dressing over the drain site daily if drainage remains. Do not change the dressing over the incision unless there is excess drainage.  Shower only, no tub bath. Call if any temperatures greater than 101 or any wound complications: [phone] during the day and ask for Dr. Charlestine Night nurse, Brunilda Payor.     Driving restrictions    Complete by:  As directed   No driving     Follow the hip precautions as taught in Physical Therapy    Complete by:  As directed      Increase activity slowly as tolerated    Complete by:  As directed             Medication List    STOP taking these medications       acetaminophen 650 MG CR tablet  Commonly known as:  TYLENOL     multivitamin with minerals Tabs tablet     naproxen sodium 220 MG tablet  Commonly known as:  ANAPROX      TAKE these medications       ALPRAZolam 0.25 MG tablet  Commonly known as:  XANAX  Take 0.25 mg by mouth every 6 (six) hours as needed for anxiety.     DSS 100 MG Caps  Take 100 mg by mouth 2 (two) times daily.     DULoxetine 20 MG capsule  Commonly known as:  CYMBALTA  Take 20 mg by mouth every morning.     ferrous sulfate 325 (65 FE) MG tablet  Take 1 tablet (325 mg total) by mouth 3 (three) times daily after meals.     methocarbamol 500 MG tablet  Commonly known as:  ROBAXIN  Take 1 tablet (500 mg total) by mouth every 6 (six) hours as needed for muscle spasms.     oxyCODONE-acetaminophen 5-325 MG per tablet  Commonly known as:  PERCOCET/ROXICET  Take 12 tablets by mouth every 4 (four) hours as needed for severe pain.     polycarbophil 625 MG tablet  Commonly known as:  FIBERCON  Take 625 mg by mouth daily.     rivaroxaban 10 MG Tabs tablet  Commonly known as:  XARELTO  Take 1 tablet (10 mg total) by mouth daily with breakfast.           Follow-up Information   Follow up  with GIOFFRE,RONALD A, MD. Schedule an appointment as soon as possible for a visit in 2 weeks.   Specialty:  Orthopedic Surgery   Contact information:   [address] [place] [address] [phone]       Signed: Ardeen Jourdain, PA-C Orthopaedic Surgery 03/18/2014, 7:37 AM

## 2014-03-18 NOTE — Clinical Social Work Note (Signed)
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  Reece Levy, MSW, Theresia Majors [phone]

## 2014-03-18 NOTE — Clinical Social Work Placement (Signed)
Clinical Social Work Department CLINICAL SOCIAL WORK PLACEMENT NOTE 03/18/2014  Patient:  RECHELL, VORWERK  Account Number:  [id number] Admit date:  03/15/2014  Clinical Social Worker:  Robin Searing  Date/time:  03/16/2014 12:38 PM  Clinical Social Work is seeking post-discharge placement for this patient at the following level of care:   SKILLED NURSING   (*CSW will update this form in Epic as items are completed)   03/16/2014  Patient/family provided with [facility] Department of Clinical Social Work's list of facilities offering this level of care within the geographic area requested by the patient (or if unable, by the patient's family).  03/16/2014  Patient/family informed of their freedom to choose among providers that offer the needed level of care, that participate in Medicare, Medicaid or managed care program needed by the patient, have an available bed and are willing to accept the patient.  03/16/2014  Patient/family informed of [facility]' ownership interest in [facility], as well as of the fact that they are under no obligation to receive care at this facility.  PASARR submitted to EDS on 03/16/2014 PASARR number received from EDS on 03/16/2014  FL2 transmitted to all facilities in geographic area requested by pt/family on  03/16/2014 FL2 transmitted to all facilities within larger geographic area on   Patient informed that his/her managed care company has contracts with or will negotiate with  certain facilities, including the following:     Patient/family informed of bed offers received:  03/18/2014 Patient chooses bed at [facility] Physician recommends and patient chooses bed at    Patient to be transferred to [facility] on  03/18/2014 Patient to be transferred to facility by EMS  The following physician request were entered in Epic:   Additional Comments: Reece Levy, MSW, Theresia Majors [phone]

## 2014-03-18 NOTE — Progress Notes (Signed)
Subjective: 3 Days Post-Op Procedure(s) (LRB): RIGHT TOTAL HIP ARTHROPLASTY (Right) Patient reports pain as 2 on 0-10 scale.Doing well today except for constipation.  Hbg is 8.3. Will change dressing and DC today to SNF.  Objective: Vital signs in last 24 hours: Temp:  [98.6 F (37 C)-101.7 F (38.7 C)] 98.8 F (37.1 C) (06/05 0448) Pulse Rate:  [110-121] 110 (06/05 0448) Resp:  [16-18] 16 (06/05 0448) BP: (111-121)/(63-70) 111/70 mmHg (06/05 0448) SpO2:  [94 %-97 %] 97 % (06/05 0448)  Intake/Output from previous day: 06/04 0701 - 06/05 0700 In: 1080 [P.O.:1080] Out: 1450 [Urine:1450] Intake/Output this shift:     Recent Labs  03/16/14 0739 03/17/14 0445 03/17/14 1506 03/18/14 0430  HGB 9.1* 8.0* 9.3* 8.3*    Recent Labs  03/17/14 0445 03/17/14 1506 03/18/14 0430  WBC 11.7*  --  14.1*  RBC 2.39*  --  2.55*  HCT 22.0* 26.1* 23.6*  PLT 179  --  211    Recent Labs  03/16/14 0435 03/17/14 0445  NA 137 138  K 4.1 3.9  CL 102 100  CO2 23 28  BUN 10 14  CREATININE 0.46* 0.49*  GLUCOSE 135* 136*  CALCIUM 8.8 8.6   No results found for this basename: LABPT, INR,  in the last 72 hours  Neurovascular intact  Assessment/Plan: 3 Days Post-Op Procedure(s) (LRB): RIGHT TOTAL HIP ARTHROPLASTY (Right) Discharge to SNF.  Georges Lynch Graylen Noboa 03/18/2014, 7:13 AM

## 2014-03-18 NOTE — Discharge Instructions (Signed)
Walk with your walker. PWB 50% right LE for one week. Then progress to WBAT.  Do not change your dressing over the incision unless there is excess drainage.  Shower only, no tub bath. Call if any temperatures greater than 101 or any wound complications: [phone] during the day and ask for Dr. Jeannetta Ellis nurse, Mackey Birchwood.

## 2014-03-19 LAB — TYPE AND SCREEN
ABO/RH(D): A POS
Antibody Screen: NEGATIVE
Unit division: 0
Unit division: 0

## 2016-05-23 ENCOUNTER — Emergency Department (HOSPITAL_COMMUNITY): Payer: BLUE CROSS/BLUE SHIELD

## 2016-05-23 ENCOUNTER — Other Ambulatory Visit (HOSPITAL_COMMUNITY): Payer: Self-pay | Admitting: Neurosurgery

## 2016-05-23 ENCOUNTER — Encounter (HOSPITAL_COMMUNITY): Payer: Self-pay

## 2016-05-23 ENCOUNTER — Ambulatory Visit (HOSPITAL_BASED_OUTPATIENT_CLINIC_OR_DEPARTMENT_OTHER)
Admission: RE | Admit: 2016-05-23 | Discharge: 2016-05-23 | Disposition: A | Discharge status: Home / Self Care | Payer: BLUE CROSS/BLUE SHIELD | Source: Ambulatory Visit | Attending: Neurosurgery | Admitting: Neurosurgery

## 2016-05-23 ENCOUNTER — Emergency Department (HOSPITAL_COMMUNITY)
Admission: EM | Admit: 2016-05-23 | Discharge: 2016-05-23 | Disposition: A | Discharge status: Home / Self Care | Payer: BLUE CROSS/BLUE SHIELD | Attending: Emergency Medicine | Admitting: Emergency Medicine

## 2016-05-23 DIAGNOSIS — R609 Edema, unspecified: Secondary | ICD-10-CM

## 2016-05-23 DIAGNOSIS — I824Z2 Acute embolism and thrombosis of unspecified deep veins of left distal lower extremity: Secondary | ICD-10-CM | POA: Insufficient documentation

## 2016-05-23 DIAGNOSIS — Z7901 Long term (current) use of anticoagulants: Secondary | ICD-10-CM | POA: Insufficient documentation

## 2016-05-23 DIAGNOSIS — Z96641 Presence of right artificial hip joint: Secondary | ICD-10-CM | POA: Diagnosis not present

## 2016-05-23 DIAGNOSIS — I82402 Acute embolism and thrombosis of unspecified deep veins of left lower extremity: Secondary | ICD-10-CM

## 2016-05-23 LAB — BASIC METABOLIC PANEL
ANION GAP: 8 (ref 5–15)
BUN: 12 mg/dL (ref 6–20)
CALCIUM: 9.6 mg/dL (ref 8.9–10.3)
CHLORIDE: 103 mmol/L (ref 101–111)
CO2: 28 mmol/L (ref 22–32)
CREATININE: 0.65 mg/dL (ref 0.44–1.00)
GFR calc non Af Amer: >60 mL/min (ref >60)
Glucose, Bld: 97 mg/dL (ref 65–99)
Potassium: 3.8 mmol/L (ref 3.5–5.1)
SODIUM: 139 mmol/L (ref 135–145)

## 2016-05-23 LAB — CBC
HCT: 38.6 % (ref 36.0–46.0)
Hemoglobin: 13.1 g/dL (ref 12.0–15.0)
MCH: 31.9 pg (ref 26.0–34.0)
MCHC: 33.9 g/dL (ref 30.0–36.0)
MCV: 93.9 fL (ref 78.0–100.0)
Platelets: 314 10*3/uL (ref 150–400)
RBC: 4.11 MIL/uL (ref 3.87–5.11)
RDW: 12.7 % (ref 11.5–15.5)
WBC: 8.9 10*3/uL (ref 4.0–10.5)

## 2016-05-23 LAB — TROPONIN I: Troponin I: <0.03 ng/mL (ref <0.03)

## 2016-05-23 MED ORDER — RIVAROXABAN (XARELTO) VTE STARTER PACK (15 & 20 MG): ORAL_TABLET | ORAL | 0 refills | Status: DC

## 2016-05-23 MED ORDER — IOPAMIDOL (ISOVUE-370) INJECTION 76%
INTRAVENOUS | Status: AC
  Administered 2016-05-23: 100 mL
  Filled 2016-05-23: qty 100

## 2016-05-23 MED ORDER — RIVAROXABAN 15 MG PO TABS
15.0000 mg | ORAL_TABLET | Freq: Once | ORAL | Status: AC
  Administered 2016-05-23: 15 mg via ORAL
  Filled 2016-05-23: qty 1

## 2016-05-23 NOTE — Discharge Instructions (Signed)
Take the blood thinner as prescribed and followup with your doctor. If you fall or injury yourself, you should come to the ED to be evaluated. Return to the ED if you develop new or worsening symptoms.

## 2016-05-23 NOTE — Progress Notes (Signed)
Preliminary results by tech - Left Lower Ext. Venous Duplex Completed. 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 All other veins are patent without evidence of thrombus. Results given to [facility], at Dr. Lovell SheehanJenkins' office. Marilynne Halstedita Uchenna Rappaport, BS, RDMS, RVT

## 2016-05-23 NOTE — ED Notes (Signed)
Pt stable, ambulatory, states understanding of discharge instructions 

## 2016-05-23 NOTE — ED Provider Notes (Signed)
[facility] DEPT Provider Note   CSN: [id number] Arrival date & time: 05/23/16  1447  First Provider Contact:  First MD Initiated Contact with Patient 05/23/16 1604        History   Chief Complaint No chief complaint on file.   HPI Colleen Jefferson is a 60 y.o. female.  Patient sent from vascular lab with DVT in left lower leg on ultrasound. She reports pain and swelling her left leg for the past 3 days. She underwent a minor back surgery by Dr. Lovell Sheehan on July 31. She denies any chest pain but does have some shortness of breath when she walks around. No cough no fever no vomiting. No focal weakness, numbness or tingling other than the chronic numbness she hasn't her left leg which is unchanged. Reports her sciatica symptoms have improved. No history of blood clots. Told to come to the ED by Dr. Lovell Sheehan office. She's never had a blood clot before. Denies any hormone use.   The history is provided by the patient.    Past Medical History:  Diagnosis Date  . Anxiety   . Arthritis   . Bleeds easily (HCC)    when blood drawn  . Guillain-Barre (HCC) 1980's  . Rocky Mountain spotted fever 5 yrs ago    Patient Active Problem List   Diagnosis Date Noted  . Acute blood loss anemia 03/17/2014  . Osteoarthritis of right hip 03/15/2014    Past Surgical History:  Procedure Laterality Date  . cervical neck surgery with plates and graft  15 yrs ago   stiffness in neck at times  . melanoma removed from head  yrs ago  . TOTAL HIP ARTHROPLASTY Right 03/15/2014   Procedure: RIGHT TOTAL HIP ARTHROPLASTY;  Surgeon: Jacki Cones, MD;  Location: [facility];  Service: Orthopedics;  Laterality: Right;    OB History    No data available       Home Medications    Prior to Admission medications   Medication Sig Start Date End Date Taking? Authorizing Provider  ALPRAZolam (XANAX) 0.25 MG tablet Take 0.25 mg by mouth every 6 (six) hours as needed for anxiety.    Historical Provider, MD    docusate sodium 100 MG CAPS Take 100 mg by mouth 2 (two) times daily. 03/18/14   Amber Celedonio Savage, PA-C  DULoxetine (CYMBALTA) 20 MG capsule Take 20 mg by mouth every morning.    Historical Provider, MD  ferrous sulfate 325 (65 FE) MG tablet Take 1 tablet (325 mg total) by mouth 3 (three) times daily after meals. 03/18/14   Amber Celedonio Savage, PA-C  methocarbamol (ROBAXIN) 500 MG tablet Take 1 tablet (500 mg total) by mouth every 6 (six) hours as needed for muscle spasms. 03/18/14   Amber Celedonio Savage, PA-C  oxyCODONE-acetaminophen (PERCOCET/ROXICET) 5-325 MG per tablet Take 12 tablets by mouth every 4 (four) hours as needed for severe pain. 03/18/14   Amber Celedonio Savage, PA-C  polycarbophil (FIBERCON) 625 MG tablet Take 625 mg by mouth daily.    Historical Provider, MD  rivaroxaban (XARELTO) 10 MG TABS tablet Take 1 tablet (10 mg total) by mouth daily with breakfast. 03/18/14   Dimitri Ped, PA-C    Family History No family history on file.  Social History Social History  Substance Use Topics  . Smoking status: Never Smoker  . Smokeless tobacco: Never Used  . Alcohol use No     Allergies   Influenza vaccines and Nyquil multi-symptom [pseudoeph-doxylamine-dm-apap]   Review of Systems Review of  Systems  Constitutional: Negative for activity change, appetite change and fever.  Eyes: Negative for visual disturbance.  Respiratory: Positive for shortness of breath. Negative for cough and chest tightness.   Cardiovascular: Positive for leg swelling. Negative for chest pain.  Gastrointestinal: Negative for abdominal pain, nausea and vomiting.  Endocrine: Negative for polyuria.  Genitourinary: Negative for dysuria, hematuria, vaginal bleeding and vaginal discharge.  Musculoskeletal: Positive for back pain. Negative for arthralgias and myalgias.  Skin: Negative for wound.  Neurological: Positive for numbness. Negative for dizziness and weakness.   A complete 10 system review of systems was obtained and  all systems are negative except as noted in the HPI and PMH.    Physical Exam Updated Vital Signs BP 133/75 (BP Location: Left Arm)   Pulse 99   Temp 98.6 F (37 C) (Oral)   Resp 17   Ht  (1.549 m)   Wt 167 lb (75.8 kg)   SpO2 98%   BMI 31.55 kg/m   Physical Exam  Constitutional: She is oriented to person, place, and time. She appears well-developed and well-nourished. No distress.  HENT:  Head: Normocephalic and atraumatic.  Right Ear: External ear normal.  Left Ear: External ear normal.  Mouth/Throat: Oropharynx is clear and moist. No oropharyngeal exudate.  Eyes: Conjunctivae and EOM are normal. Pupils are equal, round, and reactive to light.  Neck: Normal range of motion.  Cardiovascular: Normal rate, regular rhythm and normal heart sounds.   No murmur heard. Pulmonary/Chest: Effort normal. No respiratory distress.  Abdominal: Soft. She exhibits no mass. There is no tenderness. There is no guarding.  Musculoskeletal: Normal range of motion. She exhibits edema.  Midline lumbar incision healing Mild edema to LLE. +calf tenderness Toes well perfused +2 DP and PT pulses  5/5 strength in bilateral lower extremities. Ankle plantar and dorsiflexion intact. Great toe extension intact bilaterally. +2 DP and PT pulses.  Normal gait.   Neurological: She is alert and oriented to person, place, and time. No cranial nerve deficit.  Skin: Skin is warm. Capillary refill takes less than 2 seconds.     ED Treatments / Results  Labs (all labs ordered are listed, but only abnormal results are displayed) Labs Reviewed  CBC  BASIC METABOLIC PANEL  TROPONIN I    EKG  EKG Interpretation None       Radiology No results found.  Procedures Procedures (including critical care time)  Medications Ordered in ED Medications - No data to display   Initial Impression / Assessment and Plan / ED Course  I have reviewed the triage vital signs and the nursing  notes.  Pertinent labs & imaging results that were available during my care of the patient were reviewed by me and considered in my medical decision making (see chart for details).  Clinical Course  Patient with back surgery 10 days ago presenting with DVT to left leg. No CP. Some SOB with exertion only.  "Positive for deep vein thrombosis involving one of the paired gastro veins in the proximal segment. All other veins are patent without evidence of thrombus"  D/w Dr. Lovell Sheehan. Patient had lumbar micro diskectomy on 7/31.  He states it is safe to start anticoagulation.  CT negative for PE.  Will treat DVT with xarelto starter pack.  Risks of anticoagulation d/w patient. Follow up with PCP. Return precautions discussed.  Final Clinical Impressions(s) / ED Diagnoses   Final diagnoses:  DVT (deep venous thrombosis), left    New Prescriptions New  Prescriptions   No medications on file     Glynn OctaveStephen Persais Ethridge, MD 05/23/16 2109

## 2016-05-23 NOTE — ED Triage Notes (Signed)
Patient here from heart and vascular with positive DVT to left lower leg, reports minimal pain but has had swelling to same for 2-3 days. NAD

## 2017-02-25 IMAGING — CT CT ANGIO CHEST
2 of 9 series · 19 of 46 positions shown · IV contrast (OMNI)
Comparison: None.

CLINICAL DATA: Shortness of breath today. Deep venous thrombosis.
Status post lumbar surgery 05/13/2016.

EXAM:
CT ANGIOGRAPHY CHEST WITH CONTRAST
TECHNIQUE: Multidetector CT imaging of the chest was performed using the
standard protocol during bolus administration of intravenous
contrast. Multiplanar CT image reconstructions and MIPs were
obtained to evaluate the vascular anatomy.
CONTRAST:  64 cc [id number].

[Series 5: thins · axial · 0.66mm/px · z∈[+1237,+1434]mm · 16 of 223 slices shown]
[im 13/223  lung]
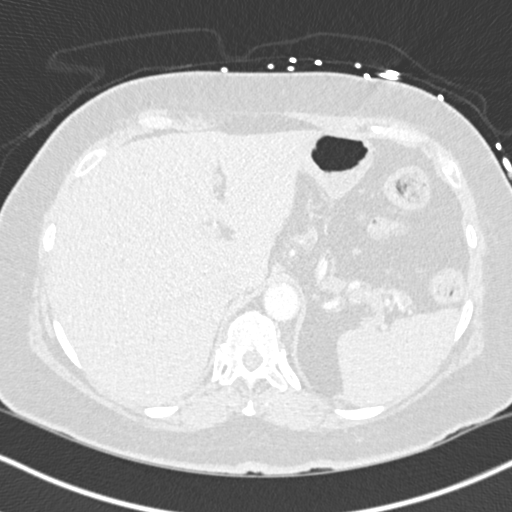
[im 25/223  soft-tissue]
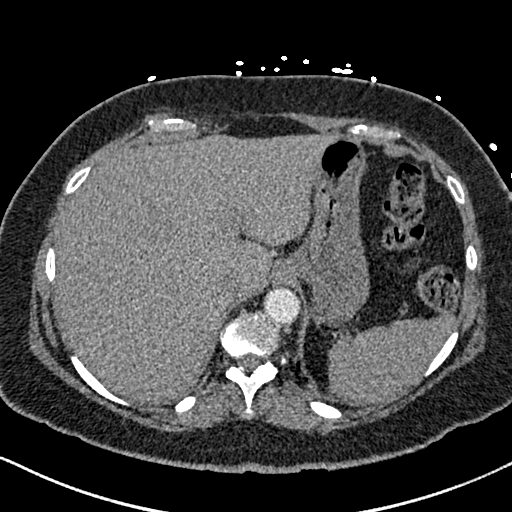
[im 38/223  lung]
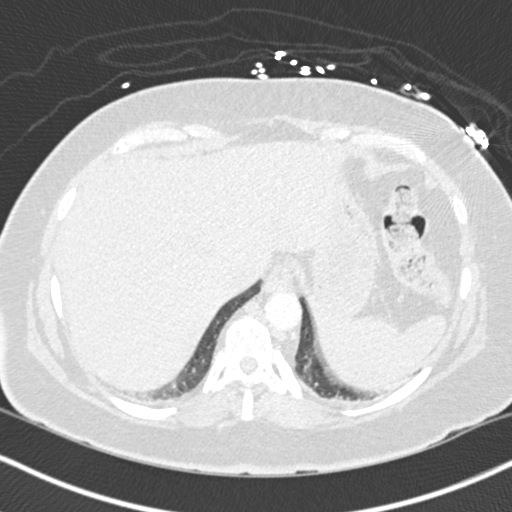
[im 50/223  soft-tissue]
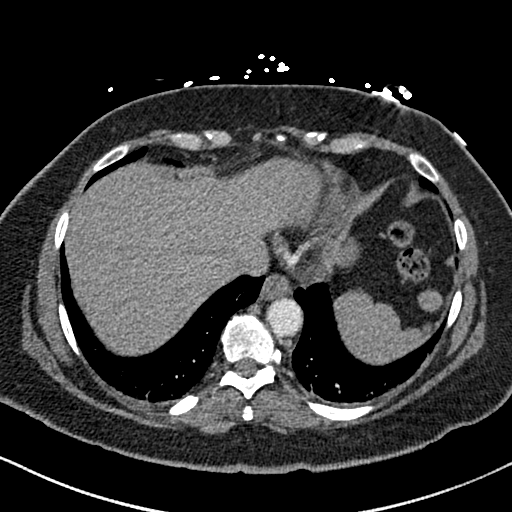
[im 62/223  lung]
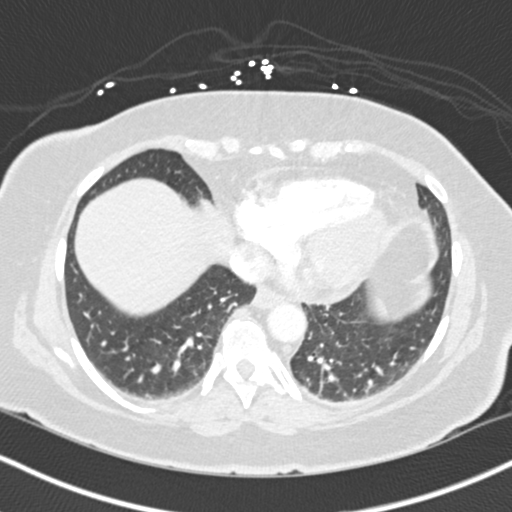
[im 75/223  soft-tissue]
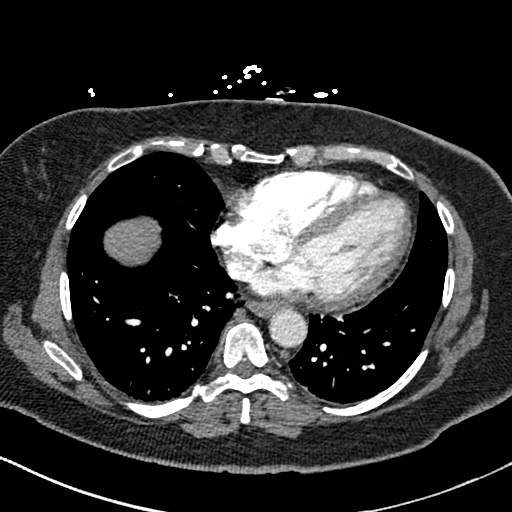
[im 87/223  lung]
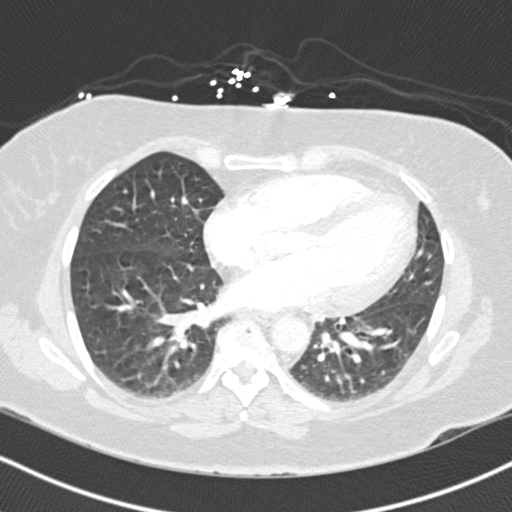
[im 99/223  soft-tissue]
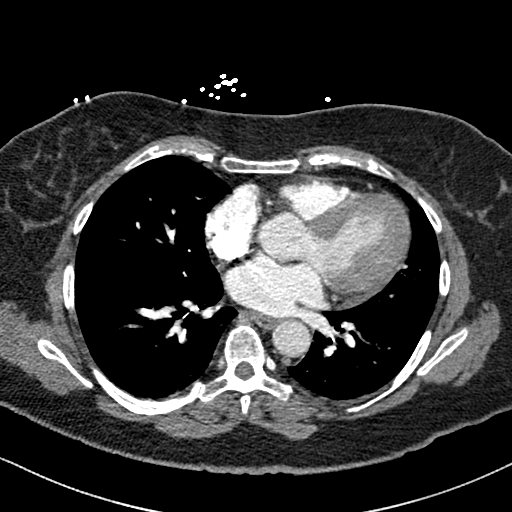
[im 124/223  lung]
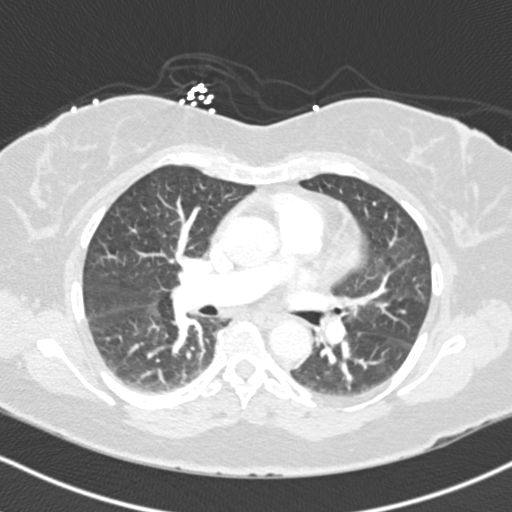
[im 136/223  soft-tissue]
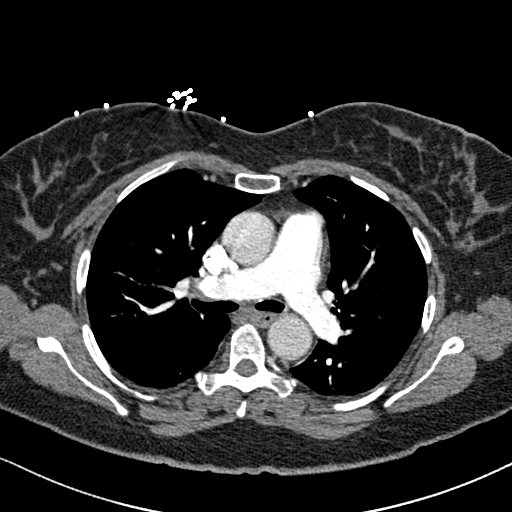
[im 149/223  lung]
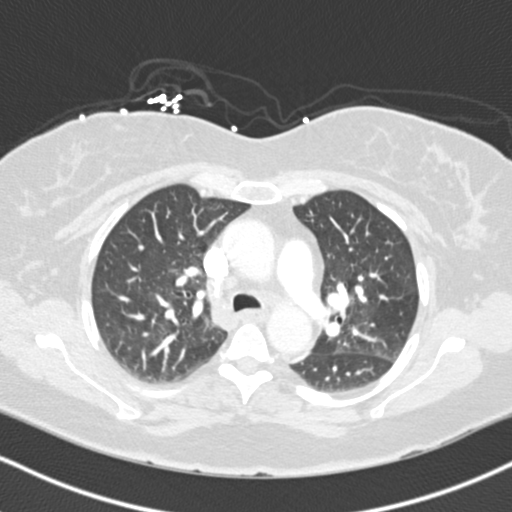
[im 161/223  soft-tissue]
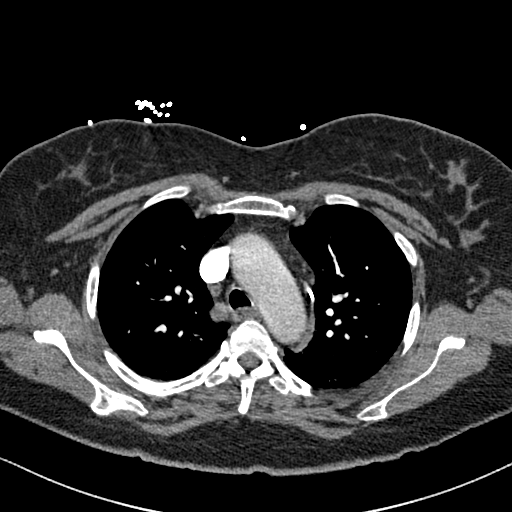
[im 173/223  lung]
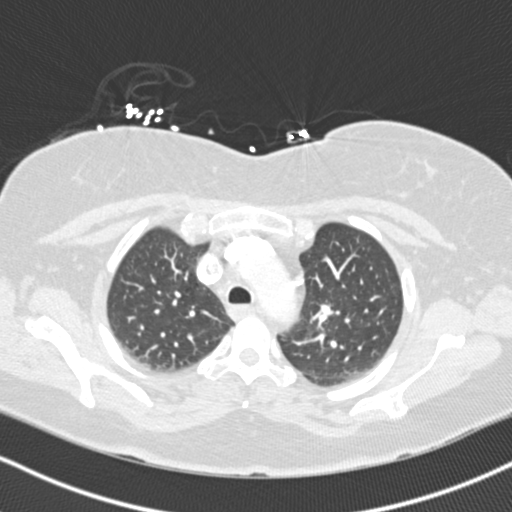
[im 186/223  soft-tissue]
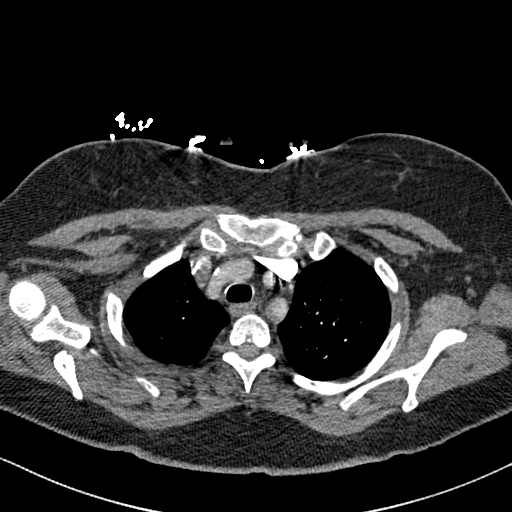
[im 198/223  lung]
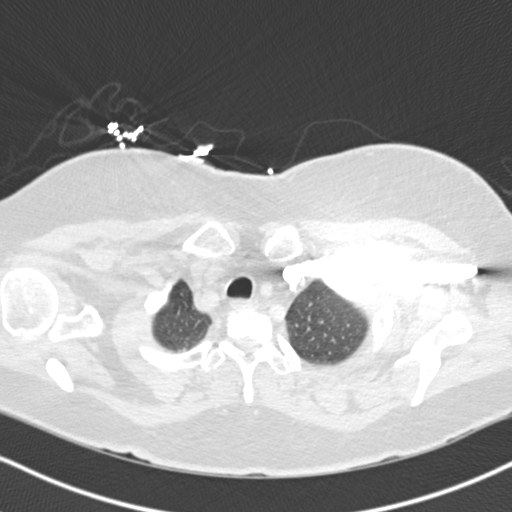
[im 210/223  soft-tissue]
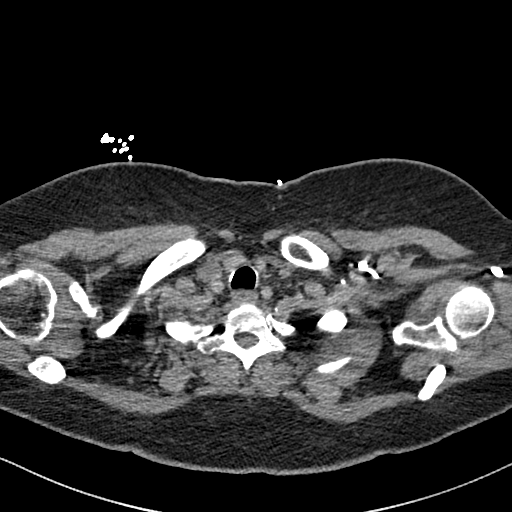

[Series 7: coronal mpr · coronal · 0.45mm/px · 3 of 107 slices shown]
[im 27/107  soft-tissue]
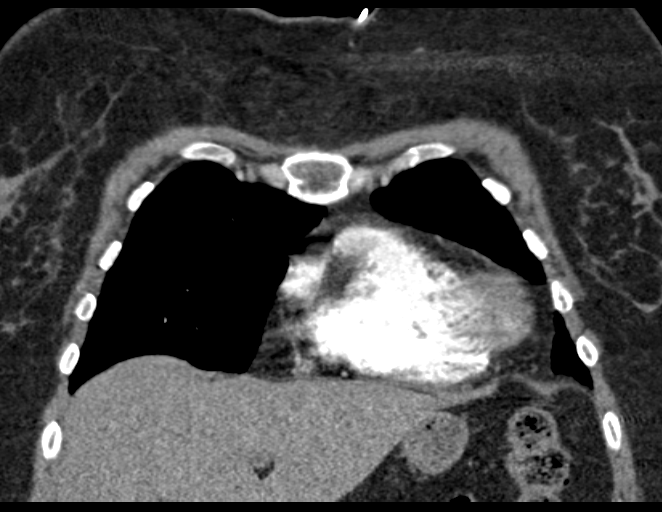
[im 54/107  soft-tissue]
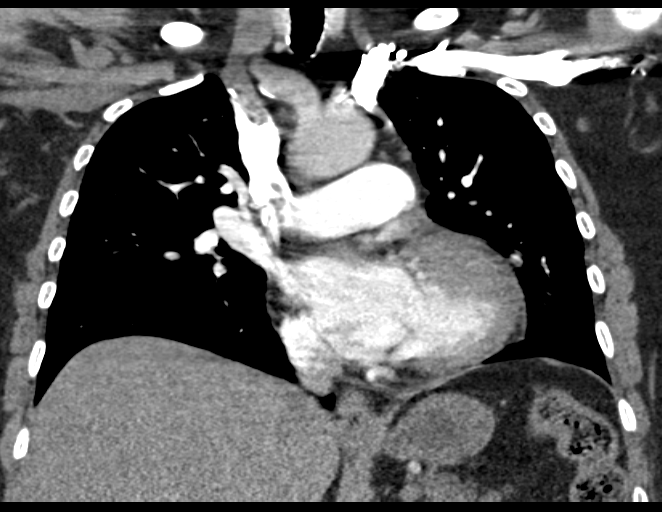
[im 80/107  soft-tissue]
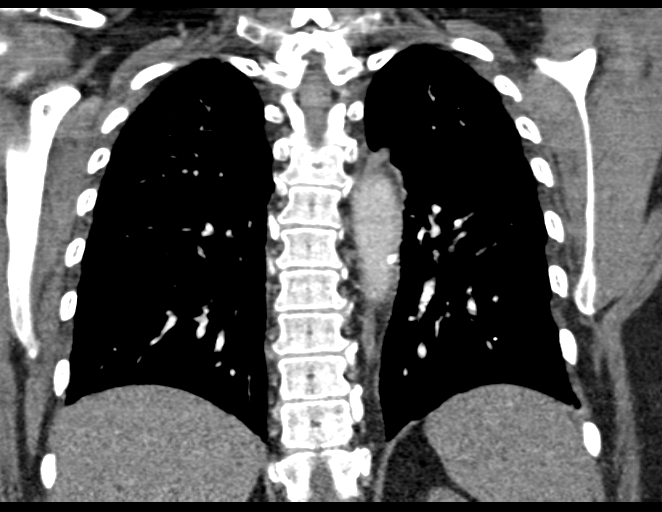

[19 of 46 positions shown; findings below may reference images not displayed]

FINDINGS: No pulmonary embolus is identified. There is no pleural or
pericardial effusion. Heart size is mildly enlarged. There is some
aortic atherosclerotic vascular disease. No axillary, hilar or
mediastinal lymphadenopathy. The lungs demonstrate only mild
dependent atelectatic change.

Imaged upper abdomen is unremarkable. No focal bony abnormality is
identified.

Review of the MIP images confirms the above findings.
IMPRESSION: Negative for pulmonary embolus or acute disease.

Cardiomegaly.

Aortic atherosclerosis.

## 2019-09-03 DIAGNOSIS — H25813 Combined forms of age-related cataract, bilateral: Secondary | ICD-10-CM | POA: Diagnosis not present

## 2019-09-29 DIAGNOSIS — N3001 Acute cystitis with hematuria: Secondary | ICD-10-CM | POA: Diagnosis not present

## 2019-09-29 DIAGNOSIS — N3091 Cystitis, unspecified with hematuria: Secondary | ICD-10-CM | POA: Diagnosis not present

## 2019-10-03 DIAGNOSIS — Z1159 Encounter for screening for other viral diseases: Secondary | ICD-10-CM | POA: Diagnosis not present

## 2019-10-03 DIAGNOSIS — J309 Allergic rhinitis, unspecified: Secondary | ICD-10-CM | POA: Diagnosis not present

## 2019-10-03 DIAGNOSIS — Z20828 Contact with and (suspected) exposure to other viral communicable diseases: Secondary | ICD-10-CM | POA: Diagnosis not present

## 2019-10-03 DIAGNOSIS — J349 Unspecified disorder of nose and nasal sinuses: Secondary | ICD-10-CM | POA: Diagnosis not present

## 2019-10-05 DIAGNOSIS — N390 Urinary tract infection, site not specified: Secondary | ICD-10-CM | POA: Diagnosis not present

## 2019-10-05 DIAGNOSIS — Z6831 Body mass index (BMI) 31.0-31.9, adult: Secondary | ICD-10-CM | POA: Diagnosis not present

## 2019-10-05 DIAGNOSIS — R509 Fever, unspecified: Secondary | ICD-10-CM | POA: Diagnosis not present

## 2020-02-16 DIAGNOSIS — M25511 Pain in right shoulder: Secondary | ICD-10-CM | POA: Diagnosis not present

## 2020-02-16 DIAGNOSIS — Z6831 Body mass index (BMI) 31.0-31.9, adult: Secondary | ICD-10-CM | POA: Diagnosis not present

## 2020-02-16 DIAGNOSIS — M25512 Pain in left shoulder: Secondary | ICD-10-CM | POA: Diagnosis not present

## 2020-02-21 DIAGNOSIS — M7541 Impingement syndrome of right shoulder: Secondary | ICD-10-CM | POA: Diagnosis not present

## 2020-06-03 DIAGNOSIS — J019 Acute sinusitis, unspecified: Secondary | ICD-10-CM | POA: Diagnosis not present

## 2020-06-03 DIAGNOSIS — Z20828 Contact with and (suspected) exposure to other viral communicable diseases: Secondary | ICD-10-CM | POA: Diagnosis not present

## 2020-07-17 DIAGNOSIS — G8929 Other chronic pain: Secondary | ICD-10-CM | POA: Diagnosis not present

## 2020-07-17 DIAGNOSIS — M5441 Lumbago with sciatica, right side: Secondary | ICD-10-CM | POA: Diagnosis not present

## 2020-07-17 DIAGNOSIS — Z6831 Body mass index (BMI) 31.0-31.9, adult: Secondary | ICD-10-CM | POA: Diagnosis not present

## 2020-07-17 DIAGNOSIS — M1990 Unspecified osteoarthritis, unspecified site: Secondary | ICD-10-CM | POA: Diagnosis not present

## 2020-07-21 DIAGNOSIS — M546 Pain in thoracic spine: Secondary | ICD-10-CM | POA: Diagnosis not present

## 2020-07-21 DIAGNOSIS — R2689 Other abnormalities of gait and mobility: Secondary | ICD-10-CM | POA: Diagnosis not present

## 2020-07-21 DIAGNOSIS — M545 Low back pain, unspecified: Secondary | ICD-10-CM | POA: Diagnosis not present

## 2020-08-10 DIAGNOSIS — M549 Dorsalgia, unspecified: Secondary | ICD-10-CM | POA: Diagnosis not present

## 2020-08-10 DIAGNOSIS — Z6831 Body mass index (BMI) 31.0-31.9, adult: Secondary | ICD-10-CM | POA: Diagnosis not present

## 2020-08-10 DIAGNOSIS — R69 Illness, unspecified: Secondary | ICD-10-CM | POA: Diagnosis not present

## 2020-08-10 DIAGNOSIS — G8929 Other chronic pain: Secondary | ICD-10-CM | POA: Diagnosis not present

## 2020-08-11 DIAGNOSIS — Z1231 Encounter for screening mammogram for malignant neoplasm of breast: Secondary | ICD-10-CM | POA: Diagnosis not present

## 2020-08-21 DIAGNOSIS — M5136 Other intervertebral disc degeneration, lumbar region: Secondary | ICD-10-CM | POA: Diagnosis not present

## 2020-08-21 DIAGNOSIS — M47816 Spondylosis without myelopathy or radiculopathy, lumbar region: Secondary | ICD-10-CM | POA: Diagnosis not present

## 2020-08-23 DIAGNOSIS — M546 Pain in thoracic spine: Secondary | ICD-10-CM | POA: Diagnosis not present

## 2020-08-23 DIAGNOSIS — R2689 Other abnormalities of gait and mobility: Secondary | ICD-10-CM | POA: Diagnosis not present

## 2020-08-23 DIAGNOSIS — M545 Low back pain, unspecified: Secondary | ICD-10-CM | POA: Diagnosis not present

## 2020-08-28 DIAGNOSIS — M545 Low back pain, unspecified: Secondary | ICD-10-CM | POA: Diagnosis not present

## 2020-08-28 DIAGNOSIS — R2689 Other abnormalities of gait and mobility: Secondary | ICD-10-CM | POA: Diagnosis not present

## 2020-08-28 DIAGNOSIS — M546 Pain in thoracic spine: Secondary | ICD-10-CM | POA: Diagnosis not present

## 2020-08-30 DIAGNOSIS — R2689 Other abnormalities of gait and mobility: Secondary | ICD-10-CM | POA: Diagnosis not present

## 2020-08-30 DIAGNOSIS — M546 Pain in thoracic spine: Secondary | ICD-10-CM | POA: Diagnosis not present

## 2020-08-30 DIAGNOSIS — M545 Low back pain, unspecified: Secondary | ICD-10-CM | POA: Diagnosis not present

## 2020-09-05 DIAGNOSIS — M545 Low back pain, unspecified: Secondary | ICD-10-CM | POA: Diagnosis not present

## 2020-09-05 DIAGNOSIS — R2689 Other abnormalities of gait and mobility: Secondary | ICD-10-CM | POA: Diagnosis not present

## 2020-09-05 DIAGNOSIS — M546 Pain in thoracic spine: Secondary | ICD-10-CM | POA: Diagnosis not present

## 2020-09-11 DIAGNOSIS — M545 Low back pain, unspecified: Secondary | ICD-10-CM | POA: Diagnosis not present

## 2020-09-11 DIAGNOSIS — R2689 Other abnormalities of gait and mobility: Secondary | ICD-10-CM | POA: Diagnosis not present

## 2020-09-11 DIAGNOSIS — M546 Pain in thoracic spine: Secondary | ICD-10-CM | POA: Diagnosis not present

## 2020-09-14 DIAGNOSIS — R2689 Other abnormalities of gait and mobility: Secondary | ICD-10-CM | POA: Diagnosis not present

## 2020-09-14 DIAGNOSIS — M545 Low back pain, unspecified: Secondary | ICD-10-CM | POA: Diagnosis not present

## 2020-09-14 DIAGNOSIS — M546 Pain in thoracic spine: Secondary | ICD-10-CM | POA: Diagnosis not present

## 2020-09-18 DIAGNOSIS — M47816 Spondylosis without myelopathy or radiculopathy, lumbar region: Secondary | ICD-10-CM | POA: Diagnosis not present

## 2020-09-18 DIAGNOSIS — M5136 Other intervertebral disc degeneration, lumbar region: Secondary | ICD-10-CM | POA: Diagnosis not present

## 2020-09-19 DIAGNOSIS — M546 Pain in thoracic spine: Secondary | ICD-10-CM | POA: Diagnosis not present

## 2020-09-19 DIAGNOSIS — M545 Low back pain, unspecified: Secondary | ICD-10-CM | POA: Diagnosis not present

## 2020-09-19 DIAGNOSIS — R2689 Other abnormalities of gait and mobility: Secondary | ICD-10-CM | POA: Diagnosis not present

## 2020-09-21 DIAGNOSIS — R2689 Other abnormalities of gait and mobility: Secondary | ICD-10-CM | POA: Diagnosis not present

## 2020-09-21 DIAGNOSIS — M546 Pain in thoracic spine: Secondary | ICD-10-CM | POA: Diagnosis not present

## 2020-09-21 DIAGNOSIS — M545 Low back pain, unspecified: Secondary | ICD-10-CM | POA: Diagnosis not present

## 2020-09-27 DIAGNOSIS — M546 Pain in thoracic spine: Secondary | ICD-10-CM | POA: Diagnosis not present

## 2020-09-27 DIAGNOSIS — R2689 Other abnormalities of gait and mobility: Secondary | ICD-10-CM | POA: Diagnosis not present

## 2020-09-27 DIAGNOSIS — M545 Low back pain, unspecified: Secondary | ICD-10-CM | POA: Diagnosis not present

## 2020-10-11 DIAGNOSIS — M545 Low back pain, unspecified: Secondary | ICD-10-CM | POA: Diagnosis not present

## 2020-10-11 DIAGNOSIS — R2689 Other abnormalities of gait and mobility: Secondary | ICD-10-CM | POA: Diagnosis not present

## 2020-10-11 DIAGNOSIS — M546 Pain in thoracic spine: Secondary | ICD-10-CM | POA: Diagnosis not present

## 2020-10-19 DIAGNOSIS — M47816 Spondylosis without myelopathy or radiculopathy, lumbar region: Secondary | ICD-10-CM | POA: Diagnosis not present

## 2020-10-19 DIAGNOSIS — M5136 Other intervertebral disc degeneration, lumbar region: Secondary | ICD-10-CM | POA: Diagnosis not present

## 2020-10-27 DIAGNOSIS — M1990 Unspecified osteoarthritis, unspecified site: Secondary | ICD-10-CM | POA: Diagnosis not present

## 2020-10-27 DIAGNOSIS — L659 Nonscarring hair loss, unspecified: Secondary | ICD-10-CM | POA: Diagnosis not present

## 2020-10-27 DIAGNOSIS — Z0000 Encounter for general adult medical examination without abnormal findings: Secondary | ICD-10-CM | POA: Diagnosis not present

## 2020-10-27 DIAGNOSIS — Z1211 Encounter for screening for malignant neoplasm of colon: Secondary | ICD-10-CM | POA: Diagnosis not present

## 2020-10-27 DIAGNOSIS — Z1331 Encounter for screening for depression: Secondary | ICD-10-CM | POA: Diagnosis not present

## 2020-10-27 DIAGNOSIS — Z6830 Body mass index (BMI) 30.0-30.9, adult: Secondary | ICD-10-CM | POA: Diagnosis not present

## 2020-11-09 DIAGNOSIS — Z01419 Encounter for gynecological examination (general) (routine) without abnormal findings: Secondary | ICD-10-CM | POA: Diagnosis not present

## 2020-11-09 DIAGNOSIS — Z13220 Encounter for screening for lipoid disorders: Secondary | ICD-10-CM | POA: Diagnosis not present

## 2020-11-09 DIAGNOSIS — L659 Nonscarring hair loss, unspecified: Secondary | ICD-10-CM | POA: Diagnosis not present

## 2020-11-09 DIAGNOSIS — I659 Occlusion and stenosis of unspecified precerebral artery: Secondary | ICD-10-CM | POA: Diagnosis not present

## 2020-11-30 DIAGNOSIS — Z01818 Encounter for other preprocedural examination: Secondary | ICD-10-CM | POA: Diagnosis not present

## 2020-12-05 DIAGNOSIS — L650 Telogen effluvium: Secondary | ICD-10-CM | POA: Diagnosis not present

## 2020-12-12 DIAGNOSIS — Z1159 Encounter for screening for other viral diseases: Secondary | ICD-10-CM | POA: Diagnosis not present

## 2020-12-12 DIAGNOSIS — Z20828 Contact with and (suspected) exposure to other viral communicable diseases: Secondary | ICD-10-CM | POA: Diagnosis not present

## 2020-12-19 DIAGNOSIS — K648 Other hemorrhoids: Secondary | ICD-10-CM | POA: Diagnosis not present

## 2020-12-19 DIAGNOSIS — K644 Residual hemorrhoidal skin tags: Secondary | ICD-10-CM | POA: Diagnosis not present

## 2020-12-19 DIAGNOSIS — Z1211 Encounter for screening for malignant neoplasm of colon: Secondary | ICD-10-CM | POA: Diagnosis not present

## 2021-03-06 DIAGNOSIS — L650 Telogen effluvium: Secondary | ICD-10-CM | POA: Diagnosis not present

## 2021-04-17 DIAGNOSIS — L02211 Cutaneous abscess of abdominal wall: Secondary | ICD-10-CM | POA: Diagnosis not present

## 2021-08-01 DIAGNOSIS — G8929 Other chronic pain: Secondary | ICD-10-CM | POA: Diagnosis not present

## 2021-08-01 DIAGNOSIS — M25562 Pain in left knee: Secondary | ICD-10-CM | POA: Diagnosis not present

## 2021-08-01 DIAGNOSIS — M1711 Unilateral primary osteoarthritis, right knee: Secondary | ICD-10-CM | POA: Diagnosis not present

## 2021-08-07 DIAGNOSIS — M25561 Pain in right knee: Secondary | ICD-10-CM | POA: Diagnosis not present

## 2021-08-07 DIAGNOSIS — M1711 Unilateral primary osteoarthritis, right knee: Secondary | ICD-10-CM | POA: Diagnosis not present

## 2021-09-11 DIAGNOSIS — L650 Telogen effluvium: Secondary | ICD-10-CM | POA: Diagnosis not present

## 2021-11-05 DIAGNOSIS — H353111 Nonexudative age-related macular degeneration, right eye, early dry stage: Secondary | ICD-10-CM | POA: Diagnosis not present

## 2021-11-05 DIAGNOSIS — H25813 Combined forms of age-related cataract, bilateral: Secondary | ICD-10-CM | POA: Diagnosis not present

## 2021-11-05 DIAGNOSIS — H04123 Dry eye syndrome of bilateral lacrimal glands: Secondary | ICD-10-CM | POA: Diagnosis not present

## 2021-11-09 DIAGNOSIS — H25812 Combined forms of age-related cataract, left eye: Secondary | ICD-10-CM | POA: Diagnosis not present

## 2021-11-09 DIAGNOSIS — H35343 Macular cyst, hole, or pseudohole, bilateral: Secondary | ICD-10-CM | POA: Diagnosis not present

## 2021-11-09 DIAGNOSIS — H353131 Nonexudative age-related macular degeneration, bilateral, early dry stage: Secondary | ICD-10-CM | POA: Diagnosis not present

## 2021-11-09 DIAGNOSIS — H25811 Combined forms of age-related cataract, right eye: Secondary | ICD-10-CM | POA: Diagnosis not present

## 2021-11-23 DIAGNOSIS — H25812 Combined forms of age-related cataract, left eye: Secondary | ICD-10-CM | POA: Diagnosis not present

## 2021-12-07 DIAGNOSIS — H25812 Combined forms of age-related cataract, left eye: Secondary | ICD-10-CM | POA: Diagnosis not present

## 2021-12-13 DIAGNOSIS — H25811 Combined forms of age-related cataract, right eye: Secondary | ICD-10-CM | POA: Diagnosis not present

## 2022-01-03 DIAGNOSIS — Z0100 Encounter for examination of eyes and vision without abnormal findings: Secondary | ICD-10-CM | POA: Diagnosis not present

## 2022-01-15 DIAGNOSIS — Z1331 Encounter for screening for depression: Secondary | ICD-10-CM | POA: Diagnosis not present

## 2022-01-15 DIAGNOSIS — M1990 Unspecified osteoarthritis, unspecified site: Secondary | ICD-10-CM | POA: Diagnosis not present

## 2022-01-15 DIAGNOSIS — Z0000 Encounter for general adult medical examination without abnormal findings: Secondary | ICD-10-CM | POA: Diagnosis not present

## 2022-01-15 DIAGNOSIS — Z6831 Body mass index (BMI) 31.0-31.9, adult: Secondary | ICD-10-CM | POA: Diagnosis not present

## 2022-01-15 DIAGNOSIS — Z13220 Encounter for screening for lipoid disorders: Secondary | ICD-10-CM | POA: Diagnosis not present

## 2022-02-18 DIAGNOSIS — L650 Telogen effluvium: Secondary | ICD-10-CM | POA: Diagnosis not present

## 2022-02-19 DIAGNOSIS — M1712 Unilateral primary osteoarthritis, left knee: Secondary | ICD-10-CM | POA: Diagnosis not present

## 2022-02-19 DIAGNOSIS — M25562 Pain in left knee: Secondary | ICD-10-CM | POA: Diagnosis not present

## 2022-02-19 DIAGNOSIS — G8929 Other chronic pain: Secondary | ICD-10-CM | POA: Diagnosis not present

## 2022-05-10 DIAGNOSIS — R300 Dysuria: Secondary | ICD-10-CM | POA: Diagnosis not present

## 2022-05-10 DIAGNOSIS — N3001 Acute cystitis with hematuria: Secondary | ICD-10-CM | POA: Diagnosis not present

## 2022-05-15 DIAGNOSIS — Z01818 Encounter for other preprocedural examination: Secondary | ICD-10-CM | POA: Diagnosis not present

## 2022-05-15 DIAGNOSIS — Z79899 Other long term (current) drug therapy: Secondary | ICD-10-CM | POA: Diagnosis not present

## 2022-05-15 DIAGNOSIS — E559 Vitamin D deficiency, unspecified: Secondary | ICD-10-CM | POA: Diagnosis not present

## 2022-05-15 DIAGNOSIS — M79609 Pain in unspecified limb: Secondary | ICD-10-CM | POA: Diagnosis not present

## 2022-05-17 DIAGNOSIS — J189 Pneumonia, unspecified organism: Secondary | ICD-10-CM | POA: Diagnosis not present

## 2022-05-28 DIAGNOSIS — M1712 Unilateral primary osteoarthritis, left knee: Secondary | ICD-10-CM | POA: Diagnosis not present

## 2022-05-30 DIAGNOSIS — Z96642 Presence of left artificial hip joint: Secondary | ICD-10-CM | POA: Diagnosis not present

## 2022-05-30 DIAGNOSIS — R600 Localized edema: Secondary | ICD-10-CM | POA: Diagnosis not present

## 2022-05-30 DIAGNOSIS — Z471 Aftercare following joint replacement surgery: Secondary | ICD-10-CM | POA: Diagnosis not present

## 2022-05-30 DIAGNOSIS — M1712 Unilateral primary osteoarthritis, left knee: Secondary | ICD-10-CM | POA: Diagnosis not present

## 2022-05-30 DIAGNOSIS — Z9889 Other specified postprocedural states: Secondary | ICD-10-CM | POA: Diagnosis not present

## 2022-05-30 DIAGNOSIS — G8918 Other acute postprocedural pain: Secondary | ICD-10-CM | POA: Diagnosis not present

## 2022-05-30 DIAGNOSIS — R2689 Other abnormalities of gait and mobility: Secondary | ICD-10-CM | POA: Diagnosis not present

## 2022-05-30 DIAGNOSIS — E669 Obesity, unspecified: Secondary | ICD-10-CM | POA: Diagnosis not present

## 2022-05-31 DIAGNOSIS — G8918 Other acute postprocedural pain: Secondary | ICD-10-CM | POA: Diagnosis not present

## 2022-05-31 DIAGNOSIS — R2689 Other abnormalities of gait and mobility: Secondary | ICD-10-CM | POA: Diagnosis not present

## 2022-05-31 DIAGNOSIS — E669 Obesity, unspecified: Secondary | ICD-10-CM | POA: Diagnosis not present

## 2022-05-31 DIAGNOSIS — M1712 Unilateral primary osteoarthritis, left knee: Secondary | ICD-10-CM | POA: Diagnosis not present

## 2022-06-01 DIAGNOSIS — Z9181 History of falling: Secondary | ICD-10-CM | POA: Diagnosis not present

## 2022-06-01 DIAGNOSIS — Z471 Aftercare following joint replacement surgery: Secondary | ICD-10-CM | POA: Diagnosis not present

## 2022-06-01 DIAGNOSIS — Z79891 Long term (current) use of opiate analgesic: Secondary | ICD-10-CM | POA: Diagnosis not present

## 2022-06-01 DIAGNOSIS — Z7901 Long term (current) use of anticoagulants: Secondary | ICD-10-CM | POA: Diagnosis not present

## 2022-06-01 DIAGNOSIS — Z791 Long term (current) use of non-steroidal anti-inflammatories (NSAID): Secondary | ICD-10-CM | POA: Diagnosis not present

## 2022-06-01 DIAGNOSIS — Z96652 Presence of left artificial knee joint: Secondary | ICD-10-CM | POA: Diagnosis not present

## 2022-06-13 DIAGNOSIS — M25562 Pain in left knee: Secondary | ICD-10-CM | POA: Diagnosis not present

## 2022-06-13 DIAGNOSIS — M25662 Stiffness of left knee, not elsewhere classified: Secondary | ICD-10-CM | POA: Diagnosis not present

## 2022-06-13 DIAGNOSIS — M6281 Muscle weakness (generalized): Secondary | ICD-10-CM | POA: Diagnosis not present

## 2022-06-13 DIAGNOSIS — R2689 Other abnormalities of gait and mobility: Secondary | ICD-10-CM | POA: Diagnosis not present

## 2022-06-19 DIAGNOSIS — M6281 Muscle weakness (generalized): Secondary | ICD-10-CM | POA: Diagnosis not present

## 2022-06-19 DIAGNOSIS — R2689 Other abnormalities of gait and mobility: Secondary | ICD-10-CM | POA: Diagnosis not present

## 2022-06-19 DIAGNOSIS — M25662 Stiffness of left knee, not elsewhere classified: Secondary | ICD-10-CM | POA: Diagnosis not present

## 2022-06-19 DIAGNOSIS — M25562 Pain in left knee: Secondary | ICD-10-CM | POA: Diagnosis not present

## 2022-06-21 DIAGNOSIS — M25662 Stiffness of left knee, not elsewhere classified: Secondary | ICD-10-CM | POA: Diagnosis not present

## 2022-06-21 DIAGNOSIS — M25562 Pain in left knee: Secondary | ICD-10-CM | POA: Diagnosis not present

## 2022-06-21 DIAGNOSIS — M6281 Muscle weakness (generalized): Secondary | ICD-10-CM | POA: Diagnosis not present

## 2022-06-21 DIAGNOSIS — R2689 Other abnormalities of gait and mobility: Secondary | ICD-10-CM | POA: Diagnosis not present

## 2022-06-25 DIAGNOSIS — R2689 Other abnormalities of gait and mobility: Secondary | ICD-10-CM | POA: Diagnosis not present

## 2022-06-25 DIAGNOSIS — M25562 Pain in left knee: Secondary | ICD-10-CM | POA: Diagnosis not present

## 2022-06-25 DIAGNOSIS — M6281 Muscle weakness (generalized): Secondary | ICD-10-CM | POA: Diagnosis not present

## 2022-06-25 DIAGNOSIS — M25662 Stiffness of left knee, not elsewhere classified: Secondary | ICD-10-CM | POA: Diagnosis not present

## 2022-06-27 DIAGNOSIS — R2689 Other abnormalities of gait and mobility: Secondary | ICD-10-CM | POA: Diagnosis not present

## 2022-06-27 DIAGNOSIS — M25662 Stiffness of left knee, not elsewhere classified: Secondary | ICD-10-CM | POA: Diagnosis not present

## 2022-06-27 DIAGNOSIS — M6281 Muscle weakness (generalized): Secondary | ICD-10-CM | POA: Diagnosis not present

## 2022-06-27 DIAGNOSIS — M25562 Pain in left knee: Secondary | ICD-10-CM | POA: Diagnosis not present

## 2022-07-02 DIAGNOSIS — M6281 Muscle weakness (generalized): Secondary | ICD-10-CM | POA: Diagnosis not present

## 2022-07-02 DIAGNOSIS — M25562 Pain in left knee: Secondary | ICD-10-CM | POA: Diagnosis not present

## 2022-07-02 DIAGNOSIS — R2689 Other abnormalities of gait and mobility: Secondary | ICD-10-CM | POA: Diagnosis not present

## 2022-07-02 DIAGNOSIS — M25662 Stiffness of left knee, not elsewhere classified: Secondary | ICD-10-CM | POA: Diagnosis not present

## 2022-07-04 DIAGNOSIS — R2689 Other abnormalities of gait and mobility: Secondary | ICD-10-CM | POA: Diagnosis not present

## 2022-07-04 DIAGNOSIS — M6281 Muscle weakness (generalized): Secondary | ICD-10-CM | POA: Diagnosis not present

## 2022-07-04 DIAGNOSIS — M25662 Stiffness of left knee, not elsewhere classified: Secondary | ICD-10-CM | POA: Diagnosis not present

## 2022-07-04 DIAGNOSIS — M25562 Pain in left knee: Secondary | ICD-10-CM | POA: Diagnosis not present

## 2022-07-09 DIAGNOSIS — M6281 Muscle weakness (generalized): Secondary | ICD-10-CM | POA: Diagnosis not present

## 2022-07-09 DIAGNOSIS — M25562 Pain in left knee: Secondary | ICD-10-CM | POA: Diagnosis not present

## 2022-07-09 DIAGNOSIS — R2689 Other abnormalities of gait and mobility: Secondary | ICD-10-CM | POA: Diagnosis not present

## 2022-07-09 DIAGNOSIS — M25662 Stiffness of left knee, not elsewhere classified: Secondary | ICD-10-CM | POA: Diagnosis not present

## 2022-07-10 DIAGNOSIS — M1712 Unilateral primary osteoarthritis, left knee: Secondary | ICD-10-CM | POA: Diagnosis not present

## 2022-07-11 DIAGNOSIS — M25662 Stiffness of left knee, not elsewhere classified: Secondary | ICD-10-CM | POA: Diagnosis not present

## 2022-07-11 DIAGNOSIS — M6281 Muscle weakness (generalized): Secondary | ICD-10-CM | POA: Diagnosis not present

## 2022-07-11 DIAGNOSIS — R2689 Other abnormalities of gait and mobility: Secondary | ICD-10-CM | POA: Diagnosis not present

## 2022-07-11 DIAGNOSIS — M25562 Pain in left knee: Secondary | ICD-10-CM | POA: Diagnosis not present

## 2022-07-15 DIAGNOSIS — M25662 Stiffness of left knee, not elsewhere classified: Secondary | ICD-10-CM | POA: Diagnosis not present

## 2022-07-15 DIAGNOSIS — Z2821 Immunization not carried out because of patient refusal: Secondary | ICD-10-CM | POA: Diagnosis not present

## 2022-07-15 DIAGNOSIS — M6281 Muscle weakness (generalized): Secondary | ICD-10-CM | POA: Diagnosis not present

## 2022-07-15 DIAGNOSIS — Z780 Asymptomatic menopausal state: Secondary | ICD-10-CM | POA: Diagnosis not present

## 2022-07-15 DIAGNOSIS — M25562 Pain in left knee: Secondary | ICD-10-CM | POA: Diagnosis not present

## 2022-07-15 DIAGNOSIS — R2689 Other abnormalities of gait and mobility: Secondary | ICD-10-CM | POA: Diagnosis not present

## 2022-07-15 DIAGNOSIS — Z6831 Body mass index (BMI) 31.0-31.9, adult: Secondary | ICD-10-CM | POA: Diagnosis not present

## 2022-07-15 DIAGNOSIS — Z01419 Encounter for gynecological examination (general) (routine) without abnormal findings: Secondary | ICD-10-CM | POA: Diagnosis not present

## 2022-07-18 DIAGNOSIS — M25562 Pain in left knee: Secondary | ICD-10-CM | POA: Diagnosis not present

## 2022-07-18 DIAGNOSIS — M6281 Muscle weakness (generalized): Secondary | ICD-10-CM | POA: Diagnosis not present

## 2022-07-18 DIAGNOSIS — M25662 Stiffness of left knee, not elsewhere classified: Secondary | ICD-10-CM | POA: Diagnosis not present

## 2022-07-18 DIAGNOSIS — R2689 Other abnormalities of gait and mobility: Secondary | ICD-10-CM | POA: Diagnosis not present

## 2022-07-25 DIAGNOSIS — N3000 Acute cystitis without hematuria: Secondary | ICD-10-CM | POA: Diagnosis not present

## 2022-07-25 DIAGNOSIS — R300 Dysuria: Secondary | ICD-10-CM | POA: Diagnosis not present

## 2022-07-26 DIAGNOSIS — M25562 Pain in left knee: Secondary | ICD-10-CM | POA: Diagnosis not present

## 2022-07-26 DIAGNOSIS — R2689 Other abnormalities of gait and mobility: Secondary | ICD-10-CM | POA: Diagnosis not present

## 2022-07-26 DIAGNOSIS — M25662 Stiffness of left knee, not elsewhere classified: Secondary | ICD-10-CM | POA: Diagnosis not present

## 2022-07-26 DIAGNOSIS — M6281 Muscle weakness (generalized): Secondary | ICD-10-CM | POA: Diagnosis not present

## 2022-08-02 DIAGNOSIS — M25562 Pain in left knee: Secondary | ICD-10-CM | POA: Diagnosis not present

## 2022-08-02 DIAGNOSIS — M25662 Stiffness of left knee, not elsewhere classified: Secondary | ICD-10-CM | POA: Diagnosis not present

## 2022-08-02 DIAGNOSIS — R2689 Other abnormalities of gait and mobility: Secondary | ICD-10-CM | POA: Diagnosis not present

## 2022-08-02 DIAGNOSIS — M6281 Muscle weakness (generalized): Secondary | ICD-10-CM | POA: Diagnosis not present

## 2022-09-20 DIAGNOSIS — Z1231 Encounter for screening mammogram for malignant neoplasm of breast: Secondary | ICD-10-CM | POA: Diagnosis not present

## 2022-11-25 DIAGNOSIS — M1712 Unilateral primary osteoarthritis, left knee: Secondary | ICD-10-CM | POA: Diagnosis not present

## 2022-12-03 DIAGNOSIS — M7541 Impingement syndrome of right shoulder: Secondary | ICD-10-CM | POA: Diagnosis not present

## 2022-12-03 DIAGNOSIS — M25511 Pain in right shoulder: Secondary | ICD-10-CM | POA: Diagnosis not present

## 2022-12-03 DIAGNOSIS — G8929 Other chronic pain: Secondary | ICD-10-CM | POA: Diagnosis not present

## 2023-04-08 DIAGNOSIS — R300 Dysuria: Secondary | ICD-10-CM | POA: Diagnosis not present

## 2023-04-08 DIAGNOSIS — N3091 Cystitis, unspecified with hematuria: Secondary | ICD-10-CM | POA: Diagnosis not present

## 2023-04-08 DIAGNOSIS — R509 Fever, unspecified: Secondary | ICD-10-CM | POA: Diagnosis not present

## 2023-07-31 DIAGNOSIS — Z961 Presence of intraocular lens: Secondary | ICD-10-CM | POA: Diagnosis not present

## 2023-07-31 DIAGNOSIS — H353111 Nonexudative age-related macular degeneration, right eye, early dry stage: Secondary | ICD-10-CM | POA: Diagnosis not present

## 2023-08-17 DIAGNOSIS — R051 Acute cough: Secondary | ICD-10-CM | POA: Diagnosis not present

## 2023-08-17 DIAGNOSIS — R0981 Nasal congestion: Secondary | ICD-10-CM | POA: Diagnosis not present

## 2023-09-23 DIAGNOSIS — R209 Unspecified disturbances of skin sensation: Secondary | ICD-10-CM | POA: Diagnosis not present

## 2023-09-23 DIAGNOSIS — R0602 Shortness of breath: Secondary | ICD-10-CM | POA: Diagnosis not present

## 2023-09-23 DIAGNOSIS — I10 Essential (primary) hypertension: Secondary | ICD-10-CM | POA: Diagnosis not present

## 2023-09-23 DIAGNOSIS — Z0000 Encounter for general adult medical examination without abnormal findings: Secondary | ICD-10-CM | POA: Diagnosis not present

## 2023-09-23 DIAGNOSIS — Z1331 Encounter for screening for depression: Secondary | ICD-10-CM | POA: Diagnosis not present

## 2023-09-26 DIAGNOSIS — R918 Other nonspecific abnormal finding of lung field: Secondary | ICD-10-CM | POA: Diagnosis not present

## 2023-09-26 DIAGNOSIS — R590 Localized enlarged lymph nodes: Secondary | ICD-10-CM | POA: Diagnosis not present

## 2023-09-26 DIAGNOSIS — R0602 Shortness of breath: Secondary | ICD-10-CM | POA: Diagnosis not present

## 2023-09-30 DIAGNOSIS — J181 Lobar pneumonia, unspecified organism: Secondary | ICD-10-CM | POA: Diagnosis not present

## 2023-10-16 DIAGNOSIS — R591 Generalized enlarged lymph nodes: Secondary | ICD-10-CM | POA: Diagnosis not present

## 2023-10-20 DIAGNOSIS — R599 Enlarged lymph nodes, unspecified: Secondary | ICD-10-CM | POA: Diagnosis not present

## 2023-10-20 DIAGNOSIS — R918 Other nonspecific abnormal finding of lung field: Secondary | ICD-10-CM | POA: Diagnosis not present

## 2023-11-04 ENCOUNTER — Encounter: Payer: Self-pay | Admitting: Oncology

## 2023-11-04 ENCOUNTER — Inpatient Hospital Stay: Payer: Medicare HMO | Admitting: Oncology

## 2023-11-04 ENCOUNTER — Other Ambulatory Visit: Payer: Self-pay | Admitting: Oncology

## 2023-11-04 ENCOUNTER — Other Ambulatory Visit: Payer: Self-pay

## 2023-11-04 ENCOUNTER — Inpatient Hospital Stay: Payer: Medicare HMO | Attending: Oncology

## 2023-11-04 VITALS — BP 164/72 | HR 70 | Temp 98.2°F | Resp 14 | Ht 61.25 in | Wt 166.0 lb

## 2023-11-04 DIAGNOSIS — R918 Other nonspecific abnormal finding of lung field: Secondary | ICD-10-CM

## 2023-11-04 DIAGNOSIS — R590 Localized enlarged lymph nodes: Secondary | ICD-10-CM

## 2023-11-04 LAB — CBC WITH DIFFERENTIAL (CANCER CENTER ONLY)
Abs Immature Granulocytes: 0.02 10*3/uL (ref 0.00–0.07)
Basophils Absolute: 0 10*3/uL (ref 0.0–0.1)
Basophils Relative: 0 %
Eosinophils Absolute: 0.2 10*3/uL (ref 0.0–0.5)
Eosinophils Relative: 2 %
HCT: 37.7 % (ref 36.0–46.0)
Hemoglobin: 13.4 g/dL (ref 12.0–15.0)
Immature Granulocytes: 0 %
Lymphocytes Relative: 36 %
Lymphs Abs: 3.3 10*3/uL (ref 0.7–4.0)
MCH: 32.7 pg (ref 26.0–34.0)
MCHC: 35.5 g/dL (ref 30.0–36.0)
MCV: 92 fL (ref 80.0–100.0)
Monocytes Absolute: 0.8 10*3/uL (ref 0.1–1.0)
Monocytes Relative: 8 %
Neutro Abs: 5 10*3/uL (ref 1.7–7.7)
Neutrophils Relative %: 54 %
Platelet Count: 284 10*3/uL (ref 150–400)
RBC: 4.1 MIL/uL (ref 3.87–5.11)
RDW: 13.1 % (ref 11.5–15.5)
WBC Count: 9.3 10*3/uL (ref 4.0–10.5)
nRBC: 0 % (ref 0.0–0.2)
nRBC: 0 /100{WBCs}

## 2023-11-04 LAB — CMP (CANCER CENTER ONLY)
ALT: 13 U/L (ref 0–44)
AST: 16 U/L (ref 15–41)
Albumin: 4.6 g/dL (ref 3.5–5.0)
Alkaline Phosphatase: 104 U/L (ref 38–126)
Anion gap: 12 (ref 5–15)
BUN: 15 mg/dL (ref 8–23)
CO2: 28 mmol/L (ref 22–32)
Calcium: 10.4 mg/dL — ABNORMAL HIGH (ref 8.9–10.3)
Chloride: 103 mmol/L (ref 98–111)
Creatinine: 0.78 mg/dL (ref 0.44–1.00)
GFR, Estimated: >60 mL/min (ref >60)
Glucose, Bld: 86 mg/dL (ref 70–99)
Potassium: 4.5 mmol/L (ref 3.5–5.1)
Sodium: 143 mmol/L (ref 135–145)
Total Bilirubin: 0.3 mg/dL (ref 0.0–1.2)
Total Protein: 7.3 g/dL (ref 6.5–8.1)

## 2023-11-04 NOTE — Progress Notes (Unsigned)
[facility]  [address] [place],  [place]  [address] [phone]  Clinic Day:  11/04/2023  Referring physician: Annamaria Helling, DO   HISTORY OF PRESENT ILLNESS:  The patient is a 68 y.o. female  who I was asked to consult upon as a recent chest CT showed bilateral pulmonary nodules, as well as suspicious hilar, mediastinal, and pericardiac lymphadenopathy.  According to the patient, she has been doing well and upper respiratory infection for over the past month.  He was initially given antibiotics to treat this.  Eventually, a chest x-ray was done, which showed a suspicious infiltrate.  This apparently led to more antibiotics and able therapy being given.  However, as her respiratory symptoms did not improve, a chest CT was done, which unexpectedly showed prominent mediastinal and hilar lymphadenopathy.  Furthermore, small, bilateral pulmonary nodules were seen.  CT scans of her abdomen/pelvis were done in mid January 2025, which revealed evidence of retroperitoneal lymphadenopathy.  Of note, the patient has yet to undergo a biopsy of any of her lymph nodes or pulmonary nodules to establish their underlying pathology.  The patient claims to have had only 1 episode of fevers over 101 degrees over these past few months, but she denies having drenching night sweats, unexplained weight loss, or any obvious peripheral lymphadenopathy.   PAST MEDICAL HISTORY:   Past Medical History:  Diagnosis Date   Anxiety    Arthritis    Bleeds easily (HCC)    when blood drawn   Guillain Barr syndrome (HCC)    Guillain-Barre (HCC) 06/15/1979   Rocky Mountain spotted fever 5 yrs ago    PAST SURGICAL HISTORY:   Past Surgical History:  Procedure Laterality Date   BACK SURGERY     LOW BACK DISC REPAIR   CATARACT EXTRACTION Bilateral    cervical neck surgery with plates and graft  15 yrs ago   stiffness in neck at times   HEMORROIDECTOMY     KNEE ARTHROPLASTY  Left    melanoma removed from head  yrs ago   TOTAL HIP ARTHROPLASTY Right 03/15/2014   Procedure: RIGHT TOTAL HIP ARTHROPLASTY;  Surgeon: Jacki Cones, MD;  Location: [facility];  Service: Orthopedics;  Laterality: Right;   WISDOM TOOTH EXTRACTION      CURRENT MEDICATIONS:   Current Outpatient Medications  Medication Sig Dispense Refill   cyclobenzaprine (FLEXERIL) 10 MG tablet Take 10 mg by mouth 3 (three) times daily as needed for muscle spasms.     docusate sodium 100 MG CAPS Take 100 mg by mouth 2 (two) times daily. (Patient not taking: Reported on 05/23/2016) 10 capsule 0   ferrous sulfate 325 (65 FE) MG tablet Take 1 tablet (325 mg total) by mouth 3 (three) times daily after meals. (Patient not taking: Reported on 05/23/2016) 30 tablet 0   Melatonin 5 MG TABS Take 1 tablet by mouth daily as needed. For back pain     methocarbamol (ROBAXIN) 500 MG tablet Take 1 tablet (500 mg total) by mouth every 6 (six) hours as needed for muscle spasms. (Patient not taking: Reported on 05/23/2016) 40 tablet 1   oxyCODONE-acetaminophen (PERCOCET/ROXICET) 5-325 MG per tablet Take 12 tablets by mouth every 4 (four) hours as needed for severe pain. 80 tablet 0   Rivaroxaban 15 & 20 MG TBPK Take as directed on package: Start with one 15mg  tablet by mouth twice a day with food. On Day 22, switch to one 20mg  tablet once a  day with food. 51 each 0   sennosides-docusate sodium (SENOKOT-S) 8.6-50 MG tablet Take 1 tablet by mouth daily as needed for constipation.     No current facility-administered medications for this visit.    ALLERGIES:   Allergies  Allergen Reactions   Doxylamine-Dm Hives    UNLISTED   Influenza Vaccines     gillian barre syndrome   Nyquil Multi-Symptom [Pseudoeph-Doxylamine-Dm-Apap]     Makes skin crawl    FAMILY HISTORY:   Family History  Problem Relation Age of Onset   Heart disease Mother    Hypertension Mother    Heart disease Father    Diabetes Father    Dementia  Father    Parkinson's disease Sister    Diabetes Sister    Diabetes Sister    Other Brother        MOTORCYCLE ACCIDENT   Other Brother        TRACTOR ACCIDENT   Other Brother        PRIMARY APHASIA    SOCIAL HISTORY:  The patient was born and raised in [place], [place].  She currently lives in town with her husband of 37 years.  They have 2 children, 4 grandchildren, and 2 great-grandchildren.  She is a retired Engineer, civil (consulting).  There is no history of alcoholism or tobacco use.  REVIEW OF SYSTEMS:  Review of Systems  Constitutional:  Positive for fatigue. Negative for fever.  HENT:   Negative for hearing loss and sore throat.   Eyes:  Negative for eye problems.  Respiratory:  Positive for shortness of breath. Negative for chest tightness, cough and hemoptysis.   Cardiovascular:  Negative for chest pain and palpitations.  Gastrointestinal:  Negative for abdominal distention, abdominal pain, blood in stool, constipation, diarrhea, nausea and vomiting.  Endocrine: Negative for hot flashes.  Genitourinary:  Negative for difficulty urinating, dysuria, frequency, hematuria and nocturia.   Musculoskeletal:  Positive for arthralgias. Negative for back pain, gait problem and myalgias.  Skin: Negative.  Negative for itching and rash.  Neurological:  Positive for dizziness. Negative for extremity weakness, gait problem, headaches, light-headedness and numbness.  Hematological: Negative.   Psychiatric/Behavioral: Negative.  Negative for depression and suicidal ideas. The patient is not nervous/anxious.    PHYSICAL EXAM:  Blood pressure (!) 164/72, pulse 70, temperature 98.2 F (36.8 C), temperature source Oral, resp. rate 14, height 5' 1.25" (1.556 m), weight 166 lb (75.3 kg), SpO2 97%. Wt Readings from Last 3 Encounters:  11/04/23 166 lb (75.3 kg)  05/23/16 167 lb (75.8 kg)  03/15/14 162 lb 3.2 oz (73.6 kg)   Body mass index is 31.11 kg/m. Performance status (ECOG): 0 - Asymptomatic Physical  Exam Constitutional:      Appearance: Normal appearance. She is not ill-appearing.  HENT:     Mouth/Throat:     Mouth: Mucous membranes are moist.     Pharynx: Oropharynx is clear. No oropharyngeal exudate or posterior oropharyngeal erythema.  Cardiovascular:     Rate and Rhythm: Normal rate and regular rhythm.     Heart sounds: No murmur heard.    No friction rub. No gallop.  Pulmonary:     Effort: Pulmonary effort is normal. No respiratory distress.     Breath sounds: Normal breath sounds. No wheezing, rhonchi or rales.  Abdominal:     General: Bowel sounds are normal. There is no distension.     Palpations: Abdomen is soft. There is no mass.     Tenderness: There is no abdominal tenderness.  Musculoskeletal:        General: No swelling.     Right lower leg: No edema.     Left lower leg: No edema.  Lymphadenopathy:     Cervical: No cervical adenopathy.     Upper Body:     Right upper body: No supraclavicular or axillary adenopathy.     Left upper body: No supraclavicular or axillary adenopathy.     Lower Body: No right inguinal adenopathy. No left inguinal adenopathy.  Skin:    General: Skin is warm.     Coloration: Skin is not jaundiced.     Findings: No lesion or rash.  Neurological:     General: No focal deficit present.     Mental Status: She is alert and oriented to person, place, and time. Mental status is at baseline.  Psychiatric:        Mood and Affect: Mood normal.        Behavior: Behavior normal.        Thought Content: Thought content normal.     LABS:      Latest Ref Rng & Units 11/04/2023    1:18 PM 05/23/2016    3:08 PM 03/18/2014    4:30 AM  CBC  WBC 4.0 - 10.5 K/uL 9.3  8.9  14.1   Hemoglobin 12.0 - 15.0 g/dL 16.1  09.6  8.3   Hematocrit 36.0 - 46.0 % 37.7  38.6  23.6   Platelets 150 - 400 K/uL 284  314  211       Latest Ref Rng & Units 11/04/2023    1:18 PM 05/23/2016    3:08 PM 03/17/2014    4:45 AM  CMP  Glucose 70 - 99 mg/dL 86  97  045    BUN 8 - 23 mg/dL 15  12  14    Creatinine 0.44 - 1.00 mg/dL 4.09  8.11  9.14   Sodium 135 - 145 mmol/L 143  139  138   Potassium 3.5 - 5.1 mmol/L 4.5  3.8  3.9   Chloride 98 - 111 mmol/L 103  103  100   CO2 22 - 32 mmol/L 28  28  28    Calcium 8.9 - 10.3 mg/dL 78.2  9.6  8.6   Total Protein 6.5 - 8.1 g/dL 7.3     Total Bilirubin 0.0 - 1.2 mg/dL 0.3     Alkaline Phos 38 - 126 U/L 104     AST 15 - 41 U/L 16     ALT 0 - 44 U/L 13       STUDIES:  Her chest CT in December 2024 and her abdominal/pelvic CT scan in January 2025 revealed the following:  FINDINGS: Cardiovascular: Normal heart size. No pericardial effusion.  Aorta: Mild plaque of the thoracic aorta without aneurysm or dissection.  Mediastinum: Moderate mediastinal, bilateral hilar, and subcarinal adenopathy consistent with metastatic disease. For reference, short axis of a subcarinal node is 1.9 cm. Prevascular nodes with short axis diameters 1.2 cm. Right hilar node measuring 2.6 cm. Metastatic nodes involving the anterior pericardial fat pad, largest 1.3 x 0.8 cm. No obvious esophageal mass. No axillary adenopathy. No supraclavicular adenopathy.  Lungs: Multiple scattered pulmonary nodules, some are solid nodules and others have tree-in-bud morphology. All measure under 1 cm. Mild consolidation in the inferior right upper lobe and portion of the right middle lobe, most likely pneumonia.  Pleura: No pleural effusions or pneumothorax is present.  Chest wall: No chest wall mass or  fluid collection.  Upper abdomen: Upper abdominal adenopathy, representative node 1.8 x 1.4 cm adjacent to the left celiac and SMA origins. A node just below the left renal vein measures 2.3 x 1.5 cm. Questionable subtle hypodense splenic lesions, splenic metastases not excluded.  Osseous: No fracture. No osseous metastatic disease is seen.  IMPRESSION: 1. Mediastinal, hilar, and subcarinal adenopathy consistent with metastatic disease. 2. Metastatic  adenopathy in the upper abdomen. Questionable splenic metastases. 3. Diffuse lung nodules bilaterally with some tree-in-bud morphology. Mild patchy consolidation in the inferior right upper lobe and right middle lobe. I suspect this all represents a combination of lung infection with metastatic disease. 4. Suggest CT abdomen/pelvis to assess full extent of disease. -------------------------------------------------------------------------------------------------------------- FINDINGS: The is subsegmental atelectasis within the right lung base. There are several pulmonary nodules within the right lung base measuring up to 4 mm.  There is fatty infiltration of the liver. There are no ductal dilatation or discrete mass seen. Gallbladder is unremarkable. There are no gallstones. Pancreas and spleen are unremarkable. There are no adrenal glands mass.  Kidneys enhance symmetrically without hydronephrosis. No cystic mass seen. No solid mass evident.  Evaluation of the bowel loops are limited due to lack of oral contrast. There is no evidence of bowel obstruction or acute appendicitis.  Bladder is unremarkable. Enlarged left retroperitoneal lymph nodes noted measuring up to 12.5 mm. The calcified uterine fibroids.  Osseous structures are intact.   IMPRESSION: Large retroperitoneal lymph node concerning for malignancy.  Hepatic steatosis.  Calcified uterine fibroids.  ASSESSMENT & PLAN:  A 68 y.o. female who I was asked to consult upon for having suspicious lymphadenopathy on CT scans that was both above and below her diaphragm.  Furthermore, her chest CT revealed bilateral pulmonary nodules.  Overall, I am very concerned that some type of malignant process is present.  Although she has been asymptomatic, her CT scans are worrisome for some type of lymphoma being present.  I will order a PET scan to better delineate all the areas of hypermetabolic lymphadenopathy she has, as well as to determine if  any evidence of visceral metastasis is present.  The scans will also be done to help guide the safest and easiest place to biopsy to establish a diagnosis.  This PET scan will be done within the week; I will inform the patient of when and where her PET scan will be scheduled.  I will see her back the following day to go over her PET scan images and their implications.  The patient understands all the plans discussed today and is in agreement with them.  I do appreciate Annamaria Helling, DO for his new consult.   Jozeph Persing Kirby Funk, MD

## 2023-11-04 NOTE — Progress Notes (Incomplete)
[facility]  [address] [place],  [place]  [address] [phone]  Clinic Day:  11/04/2023  Referring physician: Annamaria Helling, DO   HISTORY OF PRESENT ILLNESS:  The patient is a 68 y.o. female  who I was asked to consult upon for a recent chest CT shows bilateral pulmonary nodules, as well as suspicious hilar, mediastinal, and pericardiac lymphadenopathy.  According to the patient, she has been doing well and upper respiratory infection for over the past month.  He was initially given antibiotics to treat this.  Eventually, a chest x-ray was done, which showed a suspicious infiltrate.  This apparently led to more antibiotics and able therapy being given.  However, as her respiratory symptoms did not improve, a chest CT was done, which unexpectedly showed prominent mediastinal and hilar lymphadenopathy.  Furthermore, small, bilateral pulmonary nodules were seen.  CT scans of her abdomen/pelvis were done in mid January 2025, which revealed evidence of retroperitoneal lymphadenopathy.  Of note, the patient has yet to undergo a biopsy of any of her lymph nodes or pulmonary nodules to establish their underlying pathology.  The patient claims to have had only 1 episode of fevers over 101 degrees over these past few months, but she denies drenching night sweats, unexplained weight loss.  She also denies having any obvious peripheral lymphadenopathy.   PAST MEDICAL HISTORY:   Past Medical History:  Diagnosis Date  . Anxiety   . Arthritis   . Bleeds easily (HCC)    when blood drawn  . Guillain Barr syndrome (HCC)   . Guillain-Barre (HCC) 06/15/1979  . [place] spotted fever 5 yrs ago    PAST SURGICAL HISTORY:   Past Surgical History:  Procedure Laterality Date  . BACK SURGERY     LOW BACK DISC REPAIR  . CATARACT EXTRACTION Bilateral   . cervical neck surgery with plates and graft  15 yrs ago   stiffness in neck at times  . HEMORROIDECTOMY     . KNEE ARTHROPLASTY Left   . melanoma removed from head  yrs ago  . TOTAL HIP ARTHROPLASTY Right 03/15/2014   Procedure: RIGHT TOTAL HIP ARTHROPLASTY;  Surgeon: Jacki Cones, MD;  Location: [facility];  Service: Orthopedics;  Laterality: Right;  . WISDOM TOOTH EXTRACTION      CURRENT MEDICATIONS:   Current Outpatient Medications  Medication Sig Dispense Refill  . cyclobenzaprine (FLEXERIL) 10 MG tablet Take 10 mg by mouth 3 (three) times daily as needed for muscle spasms.    Marland Kitchen docusate sodium 100 MG CAPS Take 100 mg by mouth 2 (two) times daily. (Patient not taking: Reported on 05/23/2016) 10 capsule 0  . ferrous sulfate 325 (65 FE) MG tablet Take 1 tablet (325 mg total) by mouth 3 (three) times daily after meals. (Patient not taking: Reported on 05/23/2016) 30 tablet 0  . Melatonin 5 MG TABS Take 1 tablet by mouth daily as needed. For back pain    . methocarbamol (ROBAXIN) 500 MG tablet Take 1 tablet (500 mg total) by mouth every 6 (six) hours as needed for muscle spasms. (Patient not taking: Reported on 05/23/2016) 40 tablet 1  . oxyCODONE-acetaminophen (PERCOCET/ROXICET) 5-325 MG per tablet Take 12 tablets by mouth every 4 (four) hours as needed for severe pain. 80 tablet 0  . Rivaroxaban 15 & 20 MG TBPK Take as directed on package: Start with one 15mg  tablet by mouth twice a day with food. On Day 22, switch to one 20mg   tablet once a day with food. 51 each 0  . sennosides-docusate sodium (SENOKOT-S) 8.6-50 MG tablet Take 1 tablet by mouth daily as needed for constipation.     No current facility-administered medications for this visit.    ALLERGIES:   Allergies  Allergen Reactions  . Doxylamine-Dm Hives    UNLISTED  . Influenza Vaccines     gillian barre syndrome  . Nyquil Multi-Symptom [Pseudoeph-Doxylamine-Dm-Apap]     Makes skin crawl    FAMILY HISTORY:   Family History  Problem Relation Age of Onset  . Heart disease Mother   . Hypertension Mother   . Heart disease  Father   . Diabetes Father   . Dementia Father   . Parkinson's disease Sister   . Diabetes Sister   . Diabetes Sister   . Other Brother        MOTORCYCLE ACCIDENT  . Other Brother        TRACTOR ACCIDENT  . Other Brother        PRIMARY APHASIA    SOCIAL HISTORY:  The patient was born and raised in [place], [place].  She currently lives in town with her husband of 37 years.  They have 2 children, 4 grandchildren, and 2 great-grandchildren.  She is a retired Engineer, civil (consulting).  There is no history of alcoholism or tobacco use.  REVIEW OF SYSTEMS:  Review of Systems  Constitutional:  Positive for fatigue. Negative for fever.  HENT:   Negative for hearing loss and sore throat.   Eyes:  Negative for eye problems.  Respiratory:  Positive for shortness of breath. Negative for chest tightness, cough and hemoptysis.   Cardiovascular:  Negative for chest pain and palpitations.  Gastrointestinal:  Negative for abdominal distention, abdominal pain, blood in stool, constipation, diarrhea, nausea and vomiting.  Endocrine: Negative for hot flashes.  Genitourinary:  Negative for difficulty urinating, dysuria, frequency, hematuria and nocturia.   Musculoskeletal:  Positive for arthralgias. Negative for back pain, gait problem and myalgias.  Skin: Negative.  Negative for itching and rash.  Neurological:  Positive for dizziness. Negative for extremity weakness, gait problem, headaches, light-headedness and numbness.  Hematological: Negative.   Psychiatric/Behavioral: Negative.  Negative for depression and suicidal ideas. The patient is not nervous/anxious.    PHYSICAL EXAM:  Blood pressure (!) 164/72, pulse 70, temperature 98.2 F (36.8 C), temperature source Oral, resp. rate 14, height 5' 1.25" (1.556 m), weight 166 lb (75.3 kg), SpO2 97%. Wt Readings from Last 3 Encounters:  11/04/23 166 lb (75.3 kg)  05/23/16 167 lb (75.8 kg)  03/15/14 162 lb 3.2 oz (73.6 kg)   Body mass index is 31.11 kg/m. Performance  status (ECOG): 0 - Asymptomatic Physical Exam  LABS:      Latest Ref Rng & Units 11/04/2023    1:18 PM 05/23/2016    3:08 PM 03/18/2014    4:30 AM  CBC  WBC 4.0 - 10.5 K/uL 9.3  8.9  14.1   Hemoglobin 12.0 - 15.0 g/dL 52.8  41.3  8.3   Hematocrit 36.0 - 46.0 % 37.7  38.6  23.6   Platelets 150 - 400 K/uL 284  314  211       Latest Ref Rng & Units 11/04/2023    1:18 PM 05/23/2016    3:08 PM 03/17/2014    4:45 AM  CMP  Glucose 70 - 99 mg/dL 86  97  244   BUN 8 - 23 mg/dL 15  12  14    Creatinine 0.44 -  1.00 mg/dL 4.40  3.47  4.25   Sodium 135 - 145 mmol/L 143  139  138   Potassium 3.5 - 5.1 mmol/L 4.5  3.8  3.9   Chloride 98 - 111 mmol/L 103  103  100   CO2 22 - 32 mmol/L 28  28  28    Calcium 8.9 - 10.3 mg/dL 95.6  9.6  8.6   Total Protein 6.5 - 8.1 g/dL 7.3     Total Bilirubin 0.0 - 1.2 mg/dL 0.3     Alkaline Phos 38 - 126 U/L 104     AST 15 - 41 U/L 16     ALT 0 - 44 U/L 13        No results found for: "CEA1", "CEA" / No results found for: "CEA1", "CEA" No results found for: "PSA1" No results found for: "LOV564" No results found for: "CAN125"  No results found for: "TOTALPROTELP", "ALBUMINELP", "A1GS", "A2GS", "BETS", "BETA2SER", "GAMS", "MSPIKE", "SPEI" No results found for: "TIBC", "FERRITIN", "IRONPCTSAT" No results found for: "LDH"  No results found for: "AFPTUMOR", "TOTALPROTELP", "ALBUMINELP", "A1GS", "A2GS", "BETS", "BETA2SER", "GAMS", "MSPIKE", "SPEI", "LDH", "CEA1", "CEA", "PSA1", "IGASERUM", "IGGSERUM", "IGMSERUM", "THGAB", "THYROGLB"  Review Flowsheet        No data to display          STUDIES:  Her chest CT in December 2024 and her abdominal/pelvic CT scan in January 2025 revealed the following:  FINDINGS: Cardiovascular: Normal heart size. No pericardial effusion.  Aorta: Mild plaque of the thoracic aorta without aneurysm or dissection.  Mediastinum: Moderate mediastinal, bilateral hilar, and subcarinal adenopathy consistent with metastatic disease.  For reference, short axis of a subcarinal node is 1.9 cm. Prevascular nodes with short axis diameters 1.2 cm. Right hilar node measuring 2.6 cm. Metastatic nodes involving the anterior pericardial fat pad, largest 1.3 x 0.8 cm. No obvious esophageal mass. No axillary adenopathy. No supraclavicular adenopathy.  Lungs: Multiple scattered pulmonary nodules, some are solid nodules and others have tree-in-bud morphology. All measure under 1 cm. Mild consolidation in the inferior right upper lobe and portion of the right middle lobe, most likely pneumonia.  Pleura: No pleural effusions or pneumothorax is present.  Chest wall: No chest wall mass or fluid collection.  Upper abdomen: Upper abdominal adenopathy, representative node 1.8 x 1.4 cm adjacent to the left celiac and SMA origins. A node just below the left renal vein measures 2.3 x 1.5 cm. Questionable subtle hypodense splenic lesions, splenic metastases not excluded.  Osseous: No fracture. No osseous metastatic disease is seen.  IMPRESSION: 1. Mediastinal, hilar, and subcarinal adenopathy consistent with metastatic disease. 2. Metastatic adenopathy in the upper abdomen. Questionable splenic metastases. 3. Diffuse lung nodules bilaterally with some tree-in-bud morphology. Mild patchy consolidation in the inferior right upper lobe and right middle lobe. I suspect this all represents a combination of lung infection with metastatic disease. 4. Suggest CT abdomen/pelvis to assess full extent of disease. -------------------------------------------------------------------------------------------------------------- FINDINGS: The is subsegmental atelectasis within the right lung base. There are several pulmonary nodules within the right lung base measuring up to 4 mm.  There is fatty infiltration of the liver. There are no ductal dilatation or discrete mass seen. Gallbladder is unremarkable. There are no gallstones. Pancreas and spleen are unremarkable.  There are no adrenal glands mass.  Kidneys enhance symmetrically without hydronephrosis. No cystic mass seen. No solid mass evident.  Evaluation of the bowel loops are limited due to lack of oral contrast. There is no evidence of bowel  obstruction or acute appendicitis.  Bladder is unremarkable. Enlarged left retroperitoneal lymph nodes noted measuring up to 12.5 mm. The calcified uterine fibroids.  Osseous structures are intact.   IMPRESSION: Large retroperitoneal lymph node concerning for malignancy.  Hepatic steatosis.  Calcified uterine fibroids.  ASSESSMENT & PLAN:  A 68 y.o. female who I was asked to consult upon for having suspicious lymphadenopathy on CT scans that was both above and below her diaphragm.  Furthermore, her chest CT revealed bilateral pulmonary nodules.  Overall, I am very concerned that some type of malignant process is present.  Although she has been asymptomatic, her CT scans are worrisome for some type of lymphoma being present.  I will order a PET scan to better delineate all the areas of hypermetabolic lymphadenopathy she has, as well as to determine if any evidence of visceral metastasis present.  The scans could also be done to help guidethis and easiest place to biopsy to establish a diagnosis.  This PET scan will be done within the next week; I will for the patient asked when her PET scan scheduled.  I will see her back the following day to go over his PET scan results which hopefully help with An area of biopsy to establish a diagnosis..The patient understands all the plans discussed today and is in agreement with them.  I do appreciate Annamaria Helling, DO for his new consult.   Shawntell Dixson Kirby Funk, MD

## 2023-11-05 DIAGNOSIS — R590 Localized enlarged lymph nodes: Secondary | ICD-10-CM | POA: Insufficient documentation

## 2023-11-07 DIAGNOSIS — R918 Other nonspecific abnormal finding of lung field: Secondary | ICD-10-CM | POA: Diagnosis not present

## 2023-11-07 DIAGNOSIS — R935 Abnormal findings on diagnostic imaging of other abdominal regions, including retroperitoneum: Secondary | ICD-10-CM | POA: Diagnosis not present

## 2023-11-07 DIAGNOSIS — R9389 Abnormal findings on diagnostic imaging of other specified body structures: Secondary | ICD-10-CM | POA: Diagnosis not present

## 2023-11-07 DIAGNOSIS — I700 Atherosclerosis of aorta: Secondary | ICD-10-CM | POA: Diagnosis not present

## 2023-11-07 DIAGNOSIS — R937 Abnormal findings on diagnostic imaging of other parts of musculoskeletal system: Secondary | ICD-10-CM | POA: Diagnosis not present

## 2023-11-07 DIAGNOSIS — D259 Leiomyoma of uterus, unspecified: Secondary | ICD-10-CM | POA: Diagnosis not present

## 2023-11-07 DIAGNOSIS — Z96641 Presence of right artificial hip joint: Secondary | ICD-10-CM | POA: Diagnosis not present

## 2023-11-07 DIAGNOSIS — R590 Localized enlarged lymph nodes: Secondary | ICD-10-CM | POA: Diagnosis not present

## 2023-11-07 DIAGNOSIS — R932 Abnormal findings on diagnostic imaging of liver and biliary tract: Secondary | ICD-10-CM | POA: Diagnosis not present

## 2023-11-09 NOTE — Progress Notes (Unsigned)
[facility]  [address] [place],  [place]  [address] [phone]  Clinic Day:  11/10/2023  Referring physician: Annamaria Helling, DO  HISTORY OF PRESENT ILLNESS:  The patient is a 68 y.o. female  who I recently began seeing as a chest CT  showed bilateral pulmonary nodules, as well as suspicious hilar, mediastinal, and pericardiac lymphadenopathy.  As the patient has been dealing with an upper respiratory infection, a PET scan was done to determine if these areas possibly reflected an inflammatory process related to an infection or a neoplastic process.  She comes in today to go over her PET scan images and their implications.  Since her last visit, the patient has been doing well.  She continues to deny having any B symptoms or bulky peripheral lymphadenopathy anywhere that concerns her for a hematologic malignancy being present.  PHYSICAL EXAM:  Blood pressure (!) 157/70, pulse 90, temperature 98.7 F (37.1 C), temperature source Oral, resp. rate 16, height 5' 1.25" (1.556 m), weight 165 lb 8 oz (75.1 kg), SpO2 97%. Wt Readings from Last 3 Encounters:  11/10/23 165 lb 8 oz (75.1 kg)  11/04/23 166 lb (75.3 kg)  05/23/16 167 lb (75.8 kg)   Body mass index is 31.02 kg/m. Performance status (ECOG): 0 - Asymptomatic Physical Exam Constitutional:      Appearance: Normal appearance. She is not ill-appearing.  HENT:     Mouth/Throat:     Mouth: Mucous membranes are moist.     Pharynx: Oropharynx is clear. No oropharyngeal exudate or posterior oropharyngeal erythema.  Cardiovascular:     Rate and Rhythm: Normal rate and regular rhythm.     Heart sounds: No murmur heard.    No friction rub. No gallop.  Pulmonary:     Effort: Pulmonary effort is normal. No respiratory distress.     Breath sounds: Normal breath sounds. No wheezing, rhonchi or rales.  Abdominal:     General: Bowel sounds are normal. There is no distension.     Palpations: Abdomen is  soft. There is no mass.     Tenderness: There is no abdominal tenderness.  Musculoskeletal:        General: No swelling.     Right lower leg: No edema.     Left lower leg: No edema.  Lymphadenopathy:     Cervical: No cervical adenopathy.     Upper Body:     Right upper body: No supraclavicular or axillary adenopathy.     Left upper body: No supraclavicular or axillary adenopathy.     Lower Body: No right inguinal adenopathy. No left inguinal adenopathy.  Skin:    General: Skin is warm.     Coloration: Skin is not jaundiced.     Findings: No lesion or rash.  Neurological:     General: No focal deficit present.     Mental Status: She is alert and oriented to person, place, and time. Mental status is at baseline.  Psychiatric:        Mood and Affect: Mood normal.        Behavior: Behavior normal.        Thought Content: Thought content normal.     LABS:      Latest Ref Rng & Units 11/04/2023    1:18 PM 05/23/2016    3:08 PM 03/18/2014    4:30 AM  CBC  WBC 4.0 - 10.5 K/uL 9.3  8.9  14.1   Hemoglobin 12.0 - 15.0 g/dL  13.4  13.1  8.3   Hematocrit 36.0 - 46.0 % 37.7  38.6  23.6   Platelets 150 - 400 K/uL 284  314  211       Latest Ref Rng & Units 11/04/2023    1:18 PM 05/23/2016    3:08 PM 03/17/2014    4:45 AM  CMP  Glucose 70 - 99 mg/dL 86  97  409   BUN 8 - 23 mg/dL 15  12  14    Creatinine 0.44 - 1.00 mg/dL 8.11  9.14  7.82   Sodium 135 - 145 mmol/L 143  139  138   Potassium 3.5 - 5.1 mmol/L 4.5  3.8  3.9   Chloride 98 - 111 mmol/L 103  103  100   CO2 22 - 32 mmol/L 28  28  28    Calcium 8.9 - 10.3 mg/dL 95.6  9.6  8.6   Total Protein 6.5 - 8.1 g/dL 7.3     Total Bilirubin 0.0 - 1.2 mg/dL 0.3     Alkaline Phos 38 - 126 U/L 104     AST 15 - 41 U/L 16     ALT 0 - 44 U/L 13       STUDIES:  Her PET scan revealed the following:  FINDINGS: Mediastinal blood pool activity: SUV max 2.5  Liver activity: SUV max NA  NECK: Symmetric accentuated palatine tonsillar activity,  maximum SUV 5.3 on the left and 5.0 on the right, likely physiologic.  Incidental CT findings: None.  CHEST: Hypermetabolic prevascular right paratracheal, axillary, internal mammary bilateral hilar bilateral infrahilar, subcarinal, pericardial adenopathy. Index subcarinal lymph node 1.6 cm in short axis on image 54 series 4 with maximum SUV 6.2.  Primarily para bronchovascular hypermetabolic nodules my of bilaterally. A bandlike confluence posteriorly in the right middle lobe measuring about 3.4 by 1.7 cm on image 55 series 4 has a maximum SUV of 12.0. Similar 5.2 by 1.6 cm confluence inferiorly in the right upper lobe has a maximum SUV of 11.5. A left lower lobe peribronchovascular nodule measuring up to 1.1 cm in diameter has a maximum SUV of 5.5.  Incidental CT findings: Atherosclerotic thoracic aorta.  ABDOMEN/PELVIS: Scattered small hypermetabolic hepatic lesions. Index lesion in the dome of the right hepatic lobe has maximum SUV of 3.8 compared to background liver activity which is closer to 3.0.  Multiple hypermetabolic splenic lesions are present. A confluence of hypermetabolic activity inferiorly in the spleen measuring about 2.0 by 1.4 cm has a maximum SUV 7.0.  Hypermetabolic peripancreatic, porta hepatis, periaortic, celiac trunk, mesenteric, common iliac, and left external iliac adenopathy. A confluence of portacaval adenopathy measuring about 2.3 cm in short axis on image 88 series 4 has a maximum SUV 7.3. A left external iliac node measuring 0.9 cm in short axis on image 125 series 4 has maximum SUV of 4.8.  Incidental CT findings: Atherosclerosis is present, including aortoiliac atherosclerotic disease. Right uterine fibroid.  SKELETON: Numerous scattered hypermetabolic bony lesions are observed including the cervical, thoracic, lumbar spine as well as the sacrum iliac bones, left femur, and ribs. Index lesion eccentric to the left in the L1 vertebral body is  fairly in apparent on the CT scan has maximum SUV of 14.1. Index left intertrochanteric lesion is primarily lytic with maximum SUV 9.5.  Incidental CT findings: Lower cervical plate and screw fixator. Right total hip prosthesis.  IMPRESSION: 1. Widespread hypermetabolic adenopathy in the chest, abdomen, and pelvis, with hypermetabolic hepatic, splenic, lung, and  bony lesions. The pattern is most suggestive of lymphoma, with entities such as melanoma or metastatic lung cancer less likely differential diagnostic considerations. Although the appearance in the chest with peribronchovascular nodular distribution can also be seen in the setting of sarcoid, it would be highly unusual for sarcoid to involve the skeleton and abdomen in the manner depicted on today's exam. 2. Aortic atherosclerosis. 3. Right uterine fibroid. 4. Lower cervical plate and screw fixator. Right total hip prosthesis.  Aortic Atherosclerosis (ICD10-I70.0).  ASSESSMENT & PLAN:  A 68 y.o. female who I was asked to consult upon as recent CT scans showed suspicious lymphadenopathy above and below her diaphragm.  Furthermore, her chest CT revealed bilateral pulmonary nodules.  In clinic today, I went over all of her PET scan images with her and her family, for which they could see that she had widespread hypermetabolic activity involving many nodal stations.  Also seen were multiple body areas of hypermetabolic activity within her bones.  There is also an area of hypermetabolic activity in her right lung of undetermined etiology.  She understands that she likely has stage IV disease.  Ultimately a biopsy needs to be done to determine if this is a stage IV hematologic malignancy or stage IV solid organ disease.  The patient understands that with the former, systemic therapy could still be given for curative intent.  That would not be the case with a stage IV solid organ cancer.  After discussing her case with both nuclear medicine  and interventional radiology, it was thought that the best way to ascertain some type of tissue for diagnosis is to have her undergo an endobronchial ultrasound to where multiple nodal stations and her right lung disease could be biopsied and tested to determine the etiology of her widespread disease.  This referral will be placed to pulmonology for them to perform this study in the very near future.  I will see this patient back in approximately 3 weeks to go over her study results and their implications.  The patient understands all the plans discussed today and is in agreement with them.   Lynsay Fesperman Kirby Funk, MD

## 2023-11-10 ENCOUNTER — Inpatient Hospital Stay: Payer: Medicare HMO | Admitting: Oncology

## 2023-11-10 ENCOUNTER — Other Ambulatory Visit: Payer: Self-pay | Admitting: Oncology

## 2023-11-10 VITALS — BP 157/70 | HR 90 | Temp 98.7°F | Resp 16 | Ht 61.25 in | Wt 165.5 lb

## 2023-11-10 DIAGNOSIS — R590 Localized enlarged lymph nodes: Secondary | ICD-10-CM | POA: Diagnosis not present

## 2023-11-10 DIAGNOSIS — R918 Other nonspecific abnormal finding of lung field: Secondary | ICD-10-CM | POA: Diagnosis not present

## 2023-11-10 MED ORDER — ZOLPIDEM TARTRATE 5 MG PO TABS: 5.0000 mg | ORAL_TABLET | Freq: Every evening | ORAL | 1 refills | Status: AC | PRN

## 2023-11-11 ENCOUNTER — Other Ambulatory Visit: Payer: Self-pay

## 2023-11-11 DIAGNOSIS — R918 Other nonspecific abnormal finding of lung field: Secondary | ICD-10-CM

## 2023-12-01 ENCOUNTER — Telehealth: Payer: Self-pay | Admitting: Emergency Medicine

## 2023-12-01 NOTE — Telephone Encounter (Signed)
Patient has a consultation scheduled for March 12th but believes she should be seen sooner due to her lung mass. She is currently on the wait list. If something becomes open or able to double book she can be reached at [phone]

## 2023-12-03 NOTE — Telephone Encounter (Signed)
I reviewed her PET scan from [place].  Please try to get her in sooner with either RB or SG.  Okay to use nodule slot or block slot.  She will need 30 minutes.  If nothing sooner available then she could see any other provider and a new consult slot, then plan next steps in collaboration with RB

## 2023-12-03 NOTE — Telephone Encounter (Signed)
Dr Delton Coombes is seeing pt. You can ask Byrum if he wants to see sooner or keep current appointment.

## 2023-12-04 NOTE — Telephone Encounter (Signed)
Patient returned missed call. I offered her an appointment wit Sarah NP on Wed Feb 26th at 9 but has has an appointment with another provider that day. Will add her to the wait list.

## 2023-12-04 NOTE — Telephone Encounter (Signed)
Dr. Delton Coombes, please advise if 3/12 appt is okay.

## 2023-12-04 NOTE — Telephone Encounter (Signed)
I really think she needs to see someone sooner - any provider

## 2023-12-05 NOTE — Telephone Encounter (Signed)
Front staff, please place patient on wait list with any provider. Thanks

## 2023-12-08 NOTE — Telephone Encounter (Signed)
 Offered patient appointments for this week. One with Dr.Mannam and one with Dr.Byrum. Patient declined due to having another appointment this week and will keep appointment on the 12th as back up.

## 2023-12-11 DIAGNOSIS — R918 Other nonspecific abnormal finding of lung field: Secondary | ICD-10-CM | POA: Diagnosis not present

## 2023-12-11 DIAGNOSIS — R591 Generalized enlarged lymph nodes: Secondary | ICD-10-CM | POA: Diagnosis not present

## 2023-12-16 DIAGNOSIS — R591 Generalized enlarged lymph nodes: Secondary | ICD-10-CM | POA: Diagnosis not present

## 2023-12-23 ENCOUNTER — Inpatient Hospital Stay: Admitting: Oncology

## 2023-12-24 ENCOUNTER — Institutional Professional Consult (permissible substitution): Payer: Medicare HMO | Admitting: Emergency Medicine

## 2023-12-24 DIAGNOSIS — I700 Atherosclerosis of aorta: Secondary | ICD-10-CM | POA: Diagnosis not present

## 2023-12-24 DIAGNOSIS — D259 Leiomyoma of uterus, unspecified: Secondary | ICD-10-CM | POA: Diagnosis not present

## 2023-12-24 DIAGNOSIS — R590 Localized enlarged lymph nodes: Secondary | ICD-10-CM | POA: Diagnosis not present

## 2023-12-24 DIAGNOSIS — R591 Generalized enlarged lymph nodes: Secondary | ICD-10-CM | POA: Diagnosis not present

## 2023-12-30 NOTE — Progress Notes (Unsigned)
 [facility]  [address] [place],  [place]  [address] [phone]  Clinic Day:  12/31/2023  Referring physician: Annamaria Helling, DO  HISTORY OF PRESENT ILLNESS:  The patient is a 68 y.o. female  who I recently began seeing as a chest CT  showed bilateral pulmonary nodules, as well as suspicious hilar, mediastinal, and pericardiac lymphadenopathy.  A PET scan was done shortly thereafter, which showed widespread metastasis of disease throughout multiple organs and her bones.  As these findings were highly suspicious for malignancy, the patient recently underwent a bronchoscopy with a biopsy of tissue and lymph nodes within her lung.  Also, since her last visit, she underwent a left retroperitoneal lymph node biopsy for further evaluation.  She comes in today to go over these results and their implications.  Overall, the patient claims to be doing okay.  She still gets occasionally short of breath when walking long distances.  However she denies having a productive cough or hemoptysis.  PHYSICAL EXAM:  Blood pressure 136/76, pulse 74, temperature 98.1 F (36.7 C), temperature source Oral, resp. rate 20, height 5' 1.25" (1.556 m), weight 160 lb 8 oz (72.8 kg), SpO2 100%. Wt Readings from Last 3 Encounters:  12/31/23 160 lb 8 oz (72.8 kg)  11/10/23 165 lb 8 oz (75.1 kg)  11/04/23 166 lb (75.3 kg)   Body mass index is 30.08 kg/m. Performance status (ECOG): 0 - Asymptomatic Physical Exam Constitutional:      Appearance: Normal appearance. She is not ill-appearing.  HENT:     Mouth/Throat:     Mouth: Mucous membranes are moist.     Pharynx: Oropharynx is clear. No oropharyngeal exudate or posterior oropharyngeal erythema.  Cardiovascular:     Rate and Rhythm: Normal rate and regular rhythm.     Heart sounds: No murmur heard.    No friction rub. No gallop.  Pulmonary:     Effort: Pulmonary effort is normal. No respiratory distress.     Breath  sounds: Normal breath sounds. No wheezing, rhonchi or rales.  Abdominal:     General: Bowel sounds are normal. There is no distension.     Palpations: Abdomen is soft. There is no mass.     Tenderness: There is no abdominal tenderness.  Musculoskeletal:        General: No swelling.     Right lower leg: No edema.     Left lower leg: No edema.  Lymphadenopathy:     Cervical: No cervical adenopathy.     Upper Body:     Right upper body: No supraclavicular or axillary adenopathy.     Left upper body: No supraclavicular or axillary adenopathy.     Lower Body: No right inguinal adenopathy. No left inguinal adenopathy.  Skin:    General: Skin is warm.     Coloration: Skin is not jaundiced.     Findings: No lesion or rash.  Neurological:     General: No focal deficit present.     Mental Status: She is alert and oriented to person, place, and time. Mental status is at baseline.  Psychiatric:        Mood and Affect: Mood normal.        Behavior: Behavior normal.        Thought Content: Thought content normal.    LABS:      Latest Ref Rng & Units 11/04/2023    1:18 PM 05/23/2016    3:08 PM 03/18/2014  4:30 AM  CBC  WBC 4.0 - 10.5 K/uL 9.3  8.9  14.1   Hemoglobin 12.0 - 15.0 g/dL 75.6  43.3  8.3   Hematocrit 36.0 - 46.0 % 37.7  38.6  23.6   Platelets 150 - 400 K/uL 284  314  211       Latest Ref Rng & Units 11/04/2023    1:18 PM 05/23/2016    3:08 PM 03/17/2014    4:45 AM  CMP  Glucose 70 - 99 mg/dL 86  97  295   BUN 8 - 23 mg/dL 15  12  14    Creatinine 0.44 - 1.00 mg/dL 1.88  4.16  6.06   Sodium 135 - 145 mmol/L 143  139  138   Potassium 3.5 - 5.1 mmol/L 4.5  3.8  3.9   Chloride 98 - 111 mmol/L 103  103  100   CO2 22 - 32 mmol/L 28  28  28    Calcium 8.9 - 10.3 mg/dL 30.1  9.6  8.6   Total Protein 6.5 - 8.1 g/dL 7.3     Total Bilirubin 0.0 - 1.2 mg/dL 0.3     Alkaline Phos 38 - 126 U/L 104     AST 15 - 41 U/L 16     ALT 0 - 44 U/L 13      PATHOLOGY: Final Diagnosis    TISSUE LABELED "LYMPH NODE, LEFT RETROPERITONEAL", PERCUTANEOUS CORE BIOPSIES:              FIBROTIC TISSUE INVOLVED BY NONCASEATING GRANULOMATOUS INFLAMMATION (SEE COMMENT).              SPECIAL STAINS FOR ACID-FAST BACILLI AND FUNGI ARE NEGATIVE.    Electronically signed by Margorie John, MD on 12/25/2023 at 1503 EDT  Comment   A pankeratin immunohistochemical stain is diffusely negative ruling out metastatic carcinoma.  Special stains including acid-fast bacilli and fungi are negative for organisms.  The changes may represent sarcoidosis; however, this is a diagnosis of exclusion and clinical correlation is required.   BRONCHIOALVEOLAR LAVAGE; FLOW CYTOMETRIC ANALYSIS: 23% Lymphocytes identified with a 1.8:1 CD4/CD8 ratio (see comment)   COMMENT:  These findings should be interpreted in a clinical context. The predictive value of BAL cell differential in the diagnosis of interstitial lung diseases (particularly sarcoidosis) has been investigated and significant associations have been found between the total number of lymphocytes (usually >30%) and increased CD4/CD8 ratios (>3.5:1). Clinical correlation is essential for complete interpretation of these results.  LYMPH NODE, STATION 7, FINE NEEDLE ASPIRATE; FLOW CYTOMETRIC ANALYSIS:  No monotypic B-cell or immunophenotypically aberrant T-cell population identified. (See comment)   COMMENT:  There is no evidence of a phenotypically abnormal B-cell or T-cell population in this specimen. Particularly for fine needle aspiration specimens, sampling issues must always be considered when negative results are obtained, as focal lesions may not be represented in the material submitted. Additional tissue sampling maybe necessary for a definitive diagnosis. Flow cytometric immunophenotyping will not exclude other pathology if present (e.g. Hodgkin lymphoma, some T-cell lymphomas, metastatic and infectious diseases). Correlation with clinical and  cytologic findings is required for complete interpretation of these results (see [id number]).    FLOW CYTOMETRY ANALYSIS: CD45 versus side scatter analysis demonstrate a predominance of mature lymphocytes (91%). B-cells represent about 21% of lymphocytes and have a normal kappa:lambda light chain ratio of 1.3:1. Approximately 77% of lymphocytes are mature T-cells with a CD4:CD8 ratio of 6.1:1 and normal T-cell antigen expression.  Viability: 95%  LUNG, RIGHT MIDDLE LOBE, BRONCHIOALVEOLAR LAVAGE:              No malignant cells identified.  A. LYMPH NODE, STATION 7, FINE NEEDLE ASPIRATION II:              No malignant cells identified.               Lymphoid tissue present, consistent with lymph node sampling.    B. LYMPH NODE, STATION 11R, FINE NEEDLE ASPIRATION II:              Non-diagnostic specimen.               Predominately bronchial epithelial cells.               No lymphoid tissue present.    C. LYMPH NODE, STATION 4L, FINE NEEDLE ASPIRATION II:              Non-diagnostic specimen.               Predominately bronchial epithelial cells.               No lymphoid tissue present.   STUDIES:  Her PET scan revealed the following:  FINDINGS: Mediastinal blood pool activity: SUV max 2.5  Liver activity: SUV max NA  NECK: Symmetric accentuated palatine tonsillar activity, maximum SUV 5.3 on the left and 5.0 on the right, likely physiologic.  Incidental CT findings: None.  CHEST: Hypermetabolic prevascular right paratracheal, axillary, internal mammary bilateral hilar bilateral infrahilar, subcarinal, pericardial adenopathy. Index subcarinal lymph node 1.6 cm in short axis on image 54 series 4 with maximum SUV 6.2.  Primarily para bronchovascular hypermetabolic nodules my of bilaterally. A bandlike confluence posteriorly in the right middle lobe measuring about 3.4 by 1.7 cm on image 55 series 4 has a maximum SUV of 12.0. Similar 5.2 by 1.6 cm confluence inferiorly  in the right upper lobe has a maximum SUV of 11.5. A left lower lobe peribronchovascular nodule measuring up to 1.1 cm in diameter has a maximum SUV of 5.5.  Incidental CT findings: Atherosclerotic thoracic aorta.  ABDOMEN/PELVIS: Scattered small hypermetabolic hepatic lesions. Index lesion in the dome of the right hepatic lobe has maximum SUV of 3.8 compared to background liver activity which is closer to 3.0.  Multiple hypermetabolic splenic lesions are present. A confluence of hypermetabolic activity inferiorly in the spleen measuring about 2.0 by 1.4 cm has a maximum SUV 7.0.  Hypermetabolic peripancreatic, porta hepatis, periaortic, celiac trunk, mesenteric, common iliac, and left external iliac adenopathy. A confluence of portacaval adenopathy measuring about 2.3 cm in short axis on image 88 series 4 has a maximum SUV 7.3. A left external iliac node measuring 0.9 cm in short axis on image 125 series 4 has maximum SUV of 4.8.  Incidental CT findings: Atherosclerosis is present, including aortoiliac atherosclerotic disease. Right uterine fibroid.  SKELETON: Numerous scattered hypermetabolic bony lesions are observed including the cervical, thoracic, lumbar spine as well as the sacrum iliac bones, left femur, and ribs. Index lesion eccentric to the left in the L1 vertebral body is fairly in apparent on the CT scan has maximum SUV of 14.1. Index left intertrochanteric lesion is primarily lytic with maximum SUV 9.5.  Incidental CT findings: Lower cervical plate and screw fixator. Right total hip prosthesis.  IMPRESSION: 1. Widespread hypermetabolic adenopathy in the chest, abdomen, and pelvis, with hypermetabolic hepatic, splenic, lung, and bony lesions. The pattern is most suggestive of lymphoma, with  entities such as melanoma or metastatic lung cancer less likely differential diagnostic considerations. Although the appearance in the chest with peribronchovascular nodular  distribution can also be seen in the setting of sarcoid, it would be highly unusual for sarcoid to involve the skeleton and abdomen in the manner depicted on today's exam. 2. Aortic atherosclerosis. 3. Right uterine fibroid. 4. Lower cervical plate and screw fixator. Right total hip prosthesis.  Aortic Atherosclerosis (ICD10-I70.0).  ASSESSMENT & PLAN:  A 68 y.o. 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  Fortunately, it does not appear that she has any type of solid organ or hematologic malignancy.  Understandably, the patient and her family were pleased with these results.  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  However, as she has no pressing oncologic issues, I will turn her care back over to her other physicians.  The patient understands all the plans discussed today and is in agreement with them.   Skyeler Scalese Kirby Funk, MD

## 2023-12-31 ENCOUNTER — Inpatient Hospital Stay: Attending: Oncology | Admitting: Oncology

## 2023-12-31 VITALS — BP 136/76 | HR 74 | Temp 98.1°F | Resp 20 | Ht 61.25 in | Wt 160.5 lb

## 2023-12-31 DIAGNOSIS — M899 Disorder of bone, unspecified: Secondary | ICD-10-CM | POA: Insufficient documentation

## 2023-12-31 DIAGNOSIS — D869 Sarcoidosis, unspecified: Secondary | ICD-10-CM | POA: Diagnosis not present

## 2023-12-31 DIAGNOSIS — R590 Localized enlarged lymph nodes: Secondary | ICD-10-CM | POA: Insufficient documentation

## 2023-12-31 DIAGNOSIS — R918 Other nonspecific abnormal finding of lung field: Secondary | ICD-10-CM | POA: Insufficient documentation

## 2024-01-07 ENCOUNTER — Institutional Professional Consult (permissible substitution): Payer: Medicare HMO | Admitting: Pulmonary Disease

## 2024-01-09 ENCOUNTER — Other Ambulatory Visit: Payer: Self-pay | Admitting: Oncology

## 2024-02-03 DIAGNOSIS — D869 Sarcoidosis, unspecified: Secondary | ICD-10-CM | POA: Diagnosis not present

## 2024-02-03 DIAGNOSIS — I7090 Unspecified atherosclerosis: Secondary | ICD-10-CM | POA: Diagnosis not present

## 2024-02-03 DIAGNOSIS — R591 Generalized enlarged lymph nodes: Secondary | ICD-10-CM | POA: Diagnosis not present

## 2024-02-03 DIAGNOSIS — R0609 Other forms of dyspnea: Secondary | ICD-10-CM | POA: Diagnosis not present

## 2024-02-24 DIAGNOSIS — R0609 Other forms of dyspnea: Secondary | ICD-10-CM | POA: Diagnosis not present

## 2024-02-27 DIAGNOSIS — D869 Sarcoidosis, unspecified: Secondary | ICD-10-CM | POA: Diagnosis not present

## 2024-02-27 DIAGNOSIS — R0609 Other forms of dyspnea: Secondary | ICD-10-CM | POA: Diagnosis not present

## 2024-02-27 DIAGNOSIS — I2510 Atherosclerotic heart disease of native coronary artery without angina pectoris: Secondary | ICD-10-CM | POA: Diagnosis not present

## 2024-02-27 DIAGNOSIS — I700 Atherosclerosis of aorta: Secondary | ICD-10-CM | POA: Diagnosis not present

## 2024-03-23 DIAGNOSIS — D869 Sarcoidosis, unspecified: Secondary | ICD-10-CM | POA: Diagnosis not present

## 2024-03-23 DIAGNOSIS — J984 Other disorders of lung: Secondary | ICD-10-CM | POA: Diagnosis not present

## 2024-03-23 DIAGNOSIS — R0609 Other forms of dyspnea: Secondary | ICD-10-CM | POA: Diagnosis not present

## 2024-04-06 DIAGNOSIS — M25551 Pain in right hip: Secondary | ICD-10-CM | POA: Diagnosis not present

## 2024-04-06 DIAGNOSIS — G629 Polyneuropathy, unspecified: Secondary | ICD-10-CM | POA: Diagnosis not present

## 2024-04-06 DIAGNOSIS — D869 Sarcoidosis, unspecified: Secondary | ICD-10-CM | POA: Diagnosis not present

## 2024-04-06 DIAGNOSIS — R591 Generalized enlarged lymph nodes: Secondary | ICD-10-CM | POA: Diagnosis not present

## 2024-04-06 DIAGNOSIS — R0609 Other forms of dyspnea: Secondary | ICD-10-CM | POA: Diagnosis not present

## 2024-04-12 DIAGNOSIS — R590 Localized enlarged lymph nodes: Secondary | ICD-10-CM | POA: Diagnosis not present

## 2024-04-12 DIAGNOSIS — D8689 Sarcoidosis of other sites: Secondary | ICD-10-CM | POA: Diagnosis not present

## 2024-04-12 DIAGNOSIS — R591 Generalized enlarged lymph nodes: Secondary | ICD-10-CM | POA: Diagnosis not present

## 2024-04-12 DIAGNOSIS — M25551 Pain in right hip: Secondary | ICD-10-CM | POA: Diagnosis not present

## 2024-04-12 DIAGNOSIS — R918 Other nonspecific abnormal finding of lung field: Secondary | ICD-10-CM | POA: Diagnosis not present

## 2024-04-12 DIAGNOSIS — D869 Sarcoidosis, unspecified: Secondary | ICD-10-CM | POA: Diagnosis not present

## 2024-06-01 DIAGNOSIS — M25551 Pain in right hip: Secondary | ICD-10-CM | POA: Diagnosis not present

## 2024-06-01 DIAGNOSIS — R0609 Other forms of dyspnea: Secondary | ICD-10-CM | POA: Diagnosis not present

## 2024-06-01 DIAGNOSIS — R591 Generalized enlarged lymph nodes: Secondary | ICD-10-CM | POA: Diagnosis not present

## 2024-06-01 DIAGNOSIS — D869 Sarcoidosis, unspecified: Secondary | ICD-10-CM | POA: Diagnosis not present

## 2024-06-04 DIAGNOSIS — D869 Sarcoidosis, unspecified: Secondary | ICD-10-CM | POA: Diagnosis not present

## 2024-06-04 DIAGNOSIS — Z6829 Body mass index (BMI) 29.0-29.9, adult: Secondary | ICD-10-CM | POA: Diagnosis not present

## 2024-06-04 DIAGNOSIS — G4700 Insomnia, unspecified: Secondary | ICD-10-CM | POA: Diagnosis not present

## 2024-06-04 DIAGNOSIS — D860 Sarcoidosis of lung: Secondary | ICD-10-CM | POA: Diagnosis not present

## 2024-06-04 DIAGNOSIS — G629 Polyneuropathy, unspecified: Secondary | ICD-10-CM | POA: Diagnosis not present

## 2024-06-04 DIAGNOSIS — I10 Essential (primary) hypertension: Secondary | ICD-10-CM | POA: Diagnosis not present

## 2024-06-11 DIAGNOSIS — Z6829 Body mass index (BMI) 29.0-29.9, adult: Secondary | ICD-10-CM | POA: Diagnosis not present

## 2024-06-11 DIAGNOSIS — B029 Zoster without complications: Secondary | ICD-10-CM | POA: Diagnosis not present

## 2024-07-06 DIAGNOSIS — Z1339 Encounter for screening examination for other mental health and behavioral disorders: Secondary | ICD-10-CM | POA: Diagnosis not present

## 2024-07-06 DIAGNOSIS — D860 Sarcoidosis of lung: Secondary | ICD-10-CM | POA: Diagnosis not present

## 2024-07-06 DIAGNOSIS — I10 Essential (primary) hypertension: Secondary | ICD-10-CM | POA: Diagnosis not present

## 2024-07-06 DIAGNOSIS — Z0000 Encounter for general adult medical examination without abnormal findings: Secondary | ICD-10-CM | POA: Diagnosis not present

## 2024-07-29 DIAGNOSIS — H353111 Nonexudative age-related macular degeneration, right eye, early dry stage: Secondary | ICD-10-CM | POA: Diagnosis not present

## 2024-08-10 DIAGNOSIS — Z1231 Encounter for screening mammogram for malignant neoplasm of breast: Secondary | ICD-10-CM | POA: Diagnosis not present

## 2024-09-14 DIAGNOSIS — J984 Other disorders of lung: Secondary | ICD-10-CM | POA: Diagnosis not present

## 2024-09-14 DIAGNOSIS — D869 Sarcoidosis, unspecified: Secondary | ICD-10-CM | POA: Diagnosis not present

## 2024-09-14 DIAGNOSIS — R591 Generalized enlarged lymph nodes: Secondary | ICD-10-CM | POA: Diagnosis not present

## 2024-09-28 DIAGNOSIS — D869 Sarcoidosis, unspecified: Secondary | ICD-10-CM | POA: Diagnosis not present

## 2024-09-28 DIAGNOSIS — R591 Generalized enlarged lymph nodes: Secondary | ICD-10-CM | POA: Diagnosis not present

## 2024-09-28 DIAGNOSIS — R0609 Other forms of dyspnea: Secondary | ICD-10-CM | POA: Diagnosis not present

## 2024-09-28 DIAGNOSIS — G629 Polyneuropathy, unspecified: Secondary | ICD-10-CM | POA: Diagnosis not present
